# Patient Record
Sex: Male | Born: 1960
Health system: Southern US, Community
[De-identification: ages and names within clinical notes are randomized; demographics above are authoritative.]

## PROBLEM LIST (undated history)

## (undated) DIAGNOSIS — M545 Low back pain, unspecified: Secondary | ICD-10-CM

## (undated) DIAGNOSIS — I10 Essential (primary) hypertension: Secondary | ICD-10-CM

## (undated) DIAGNOSIS — E785 Hyperlipidemia, unspecified: Secondary | ICD-10-CM

## (undated) DIAGNOSIS — M1990 Unspecified osteoarthritis, unspecified site: Secondary | ICD-10-CM

## (undated) HISTORY — PX: OTHER SURGICAL HISTORY: SHX169

## (undated) HISTORY — PX: TONSILLECTOMY: SHX5217

## (undated) HISTORY — DX: Low back pain: M54.5

## (undated) HISTORY — DX: Unspecified osteoarthritis, unspecified site: M19.90

## (undated) HISTORY — DX: Hyperlipidemia, unspecified: E78.5

## (undated) HISTORY — PX: WISDOM TOOTH EXTRACTION: SHX21

## (undated) HISTORY — DX: Essential (primary) hypertension: I10

## (undated) HISTORY — DX: Low back pain, unspecified: M54.50

## (undated) HISTORY — PX: COLONOSCOPY: SHX174

## (undated) HISTORY — PX: CIRCUMCISION: SHX1350

---

## 1986-08-11 DIAGNOSIS — F1910 Other psychoactive substance abuse, uncomplicated: Secondary | ICD-10-CM

## 1986-08-11 HISTORY — DX: Other psychoactive substance abuse, uncomplicated: F19.10

## 1998-08-22 ENCOUNTER — Ambulatory Visit (HOSPITAL_BASED_OUTPATIENT_CLINIC_OR_DEPARTMENT_OTHER): Admission: RE | Admit: 1998-08-22 | Discharge: 1998-08-22 | Payer: Self-pay | Admitting: Orthopedic Surgery

## 2000-04-23 ENCOUNTER — Ambulatory Visit (HOSPITAL_COMMUNITY): Admission: RE | Admit: 2000-04-23 | Discharge: 2000-04-23 | Payer: Self-pay | Admitting: Urology

## 2001-08-24 ENCOUNTER — Encounter: Payer: Self-pay | Admitting: Family Medicine

## 2001-08-24 ENCOUNTER — Encounter: Admission: RE | Admit: 2001-08-24 | Discharge: 2001-08-24 | Payer: Self-pay | Admitting: Family Medicine

## 2001-08-28 ENCOUNTER — Encounter: Payer: Self-pay | Admitting: Family Medicine

## 2001-08-28 ENCOUNTER — Encounter: Admission: RE | Admit: 2001-08-28 | Discharge: 2001-08-28 | Payer: Self-pay | Admitting: Family Medicine

## 2001-08-31 ENCOUNTER — Encounter: Payer: Self-pay | Admitting: Family Medicine

## 2001-08-31 ENCOUNTER — Encounter: Admission: RE | Admit: 2001-08-31 | Discharge: 2001-08-31 | Payer: Self-pay | Admitting: Family Medicine

## 2003-06-07 ENCOUNTER — Encounter: Admission: RE | Admit: 2003-06-07 | Discharge: 2003-06-07 | Payer: Self-pay | Admitting: Family Medicine

## 2005-01-01 ENCOUNTER — Ambulatory Visit: Payer: Self-pay | Admitting: Family Medicine

## 2005-03-04 ENCOUNTER — Ambulatory Visit: Payer: Self-pay | Admitting: Family Medicine

## 2005-03-11 ENCOUNTER — Ambulatory Visit: Payer: Self-pay | Admitting: Family Medicine

## 2008-05-01 ENCOUNTER — Emergency Department (HOSPITAL_COMMUNITY): Admission: EM | Admit: 2008-05-01 | Discharge: 2008-05-01 | Payer: Self-pay | Admitting: Family Medicine

## 2009-08-16 ENCOUNTER — Ambulatory Visit: Payer: Self-pay | Admitting: Family Medicine

## 2009-08-16 DIAGNOSIS — F17200 Nicotine dependence, unspecified, uncomplicated: Secondary | ICD-10-CM | POA: Insufficient documentation

## 2009-08-16 DIAGNOSIS — R030 Elevated blood-pressure reading, without diagnosis of hypertension: Secondary | ICD-10-CM | POA: Insufficient documentation

## 2009-08-22 ENCOUNTER — Ambulatory Visit: Payer: Self-pay | Admitting: Family Medicine

## 2009-10-03 ENCOUNTER — Ambulatory Visit: Payer: Self-pay | Admitting: Family Medicine

## 2009-10-03 DIAGNOSIS — I10 Essential (primary) hypertension: Secondary | ICD-10-CM | POA: Insufficient documentation

## 2009-10-04 ENCOUNTER — Telehealth: Payer: Self-pay | Admitting: Family Medicine

## 2009-11-06 ENCOUNTER — Ambulatory Visit: Payer: Self-pay | Admitting: Family Medicine

## 2009-11-06 LAB — CONVERTED CEMR LAB
ALT: 13 units/L (ref 0–53)
AST: 16 units/L (ref 0–37)
Albumin: 4.1 g/dL (ref 3.5–5.2)
Alkaline Phosphatase: 57 units/L (ref 39–117)
BUN: 14 mg/dL (ref 6–23)
Basophils Absolute: 0 10*3/uL (ref 0.0–0.1)
Basophils Relative: 0.1 % (ref 0.0–3.0)
Bilirubin, Direct: 0 mg/dL (ref 0.0–0.3)
CO2: 31 meq/L (ref 19–32)
Calcium: 9.3 mg/dL (ref 8.4–10.5)
Chloride: 105 meq/L (ref 96–112)
Cholesterol: 223 mg/dL — ABNORMAL HIGH (ref 0–200)
Creatinine, Ser: 1.2 mg/dL (ref 0.4–1.5)
Direct LDL: 161.2 mg/dL
Eosinophils Absolute: 0.1 10*3/uL (ref 0.0–0.7)
Eosinophils Relative: 2.2 % (ref 0.0–5.0)
GFR calc non Af Amer: 82.65 mL/min (ref >60)
Glucose, Bld: 95 mg/dL (ref 70–99)
HCT: 43.4 % (ref 39.0–52.0)
HDL: 56.1 mg/dL (ref >39.00)
Hemoglobin: 14.2 g/dL (ref 13.0–17.0)
Lymphocytes Relative: 42.4 % (ref 12.0–46.0)
Lymphs Abs: 1.9 10*3/uL (ref 0.7–4.0)
MCHC: 32.7 g/dL (ref 30.0–36.0)
MCV: 91.5 fL (ref 78.0–100.0)
Monocytes Absolute: 0.5 10*3/uL (ref 0.1–1.0)
Monocytes Relative: 11.1 % (ref 3.0–12.0)
Neutro Abs: 1.9 10*3/uL (ref 1.4–7.7)
Neutrophils Relative %: 44.2 % (ref 43.0–77.0)
PSA: 1.08 ng/mL (ref 0.10–4.00)
Platelets: 158 10*3/uL (ref 150.0–400.0)
Potassium: 4.7 meq/L (ref 3.5–5.1)
RBC: 4.74 M/uL (ref 4.22–5.81)
RDW: 13.5 % (ref 11.5–14.6)
Sodium: 145 meq/L (ref 135–145)
TSH: 1.75 microintl units/mL (ref 0.35–5.50)
Total Bilirubin: 0.7 mg/dL (ref 0.3–1.2)
Total CHOL/HDL Ratio: 4
Total Protein: 6.8 g/dL (ref 6.0–8.3)
Triglycerides: 57 mg/dL (ref 0.0–149.0)
VLDL: 11.4 mg/dL (ref 0.0–40.0)
WBC: 4.4 10*3/uL — ABNORMAL LOW (ref 4.5–10.5)

## 2009-12-04 ENCOUNTER — Ambulatory Visit: Payer: Self-pay | Admitting: Family Medicine

## 2010-09-10 NOTE — Assessment & Plan Note (Signed)
Summary: pt not feeling better/njr   History of Present Illness: Carlos Walker is a 50 year old male, who was seen on January the sixth for viral infection.  He was given a note to to cover him from January the fourth when he first got ill through January the 11th.  He states he wants to file FMLA .  Explained FMLa is not for a viral infection.  He states he has to go to an urgent care on 68 to be certified to go back to work.  Okay to go back to work today as previously noted  Allergies: No Known Drug Allergies   Complete Medication List: 1)  Chantix Starting Month Pak 0.5 Mg X 11 & 1 Mg X 42 Tabs (Varenicline tartrate) .... Uad 2)  Hydromet 5-1.5 Mg/75ml Syrp (Hydrocodone-homatropine) .Marland Kitchen.. 1 or 2 tsps at bedtime as needed  Other Orders: No Charge Patient Arrived (NCPA0) (NCPA0)

## 2010-09-10 NOTE — Letter (Signed)
Summary: Out of Work  Adult nurse at [employer]  [address]   [place], [place] [address]   Phone: [phone]  Fax: [phone]    August 16, 2009   Employee:  SEAN MALINOWSKI Nez    To Whom It May Concern:   For Medical reasons, please excuse the above named employee from work for the following dates:  Start:   August 14, 2009  End:    If you need additional information, please feel free to contact our office.         Sincerely,    Kelle Darting, MD

## 2010-09-10 NOTE — Assessment & Plan Note (Signed)
Summary: 1 month rov/njr   Vital Signs:  Patient profile:   50 year old male Weight:      199 pounds Temp:     98.7 degrees F oral BP sitting:   150 / 102  (left arm) Cuff size:   regular  Vitals Entered By: Kern Reap CMA Duncan Dull) (October 03, 2009 11:27 AM)  Reason for Visit follow up blood pressure  History of Present Illness: Carlos Walker is a 50 year old male, smoker, but down to 3 cigarettes daily who comes back today for evaluation of tobacco abuse and hypertension.  He's cut down to 3 cigarettes a day.  Has not purchased the chantix.  We discussed her his options.  BP at home, still elevated, 130 to 140 systolic 90, diastolic.  BP here today 150/102.  Mother, father both deceased, had underlying hypertension.  One sister with high blood pressure  Allergies: No Known Drug Allergies PMH-FH-SH reviewed for relevance  Review of Systems      See HPI  Physical Exam  General:  Well-developed,well-nourished,in no acute distress; alert,appropriate and cooperative throughout examination Heart:  150/102   Impression & Recommendations:  Problem # 1:  HYPERTENSION NEC (ICD-997.91) Assessment New  Problem # 2:  TOBACCO USE (ICD-305.1) Assessment: Improved  His updated medication list for this problem includes:    Chantix Starting Month Pak 0.5 Mg X 11 & 1 Mg X 42 Tabs (Varenicline tartrate) ..... Uad  Orders: Tobacco use cessation intermediate 3-10 minutes ([address]406)  Complete Medication List: 1)  Chantix Starting Month Pak 0.5 Mg X 11 & 1 Mg X 42 Tabs (Varenicline tartrate) .... Uad 2)  Lisinopril 10 Mg Tabs (Lisinopril) .... Take 1 tablet by mouth every morning  Patient Instructions: 1)  tried the chantix  as was outlined. 2)  Begin lisinopril 10 mg q.a.m. daily check a morning blood pressure daily.  Return in 4 weeks for follow Prescriptions: LISINOPRIL 10 MG TABS (LISINOPRIL) Take 1 tablet by mouth every morning  #100 x 3   Entered and Authorized by:   Roderick Pee MD   Signed by:   Roderick Pee MD on 10/03/2009   Method used:   Electronically to        [facility] [address] [id number][facility] (retail)       [address]       [place], [place]  [address]       Ph: [phone] or [phone]       Fax: [phone]   RxID:   [id number]

## 2010-09-10 NOTE — Progress Notes (Signed)
Summary: samples  Phone Note Call from Patient   Caller: Patient Call For: Roderick Pee MD Summary of Call: wants to know if you have any samples of Levitra? Initial call taken by: Raechel Ache, RN,  October 04, 2009 12:19 PM  Follow-up for Phone Call        no.........call-in Levitra 20 mg, dispense 6 refills x 11 Follow-up by: Roderick Pee MD,  October 08, 2009 8:22 AM  Additional Follow-up for Phone Call Additional follow up Details #1::        left message on machine for patient to call back if he would like a rx called in Additional Follow-up by: Kern Reap CMA Duncan Dull),  October 08, 2009 10:54 AM    Additional Follow-up for Phone Call Additional follow up Details #2::    patient is going to do some research and then call back for a new rx. Follow-up by: Kern Reap CMA Duncan Dull),  October 10, 2009 1:07 PM

## 2010-09-10 NOTE — Assessment & Plan Note (Signed)
Summary: PT RE-EST // RS   Vital Signs:  Patient profile:   50 year old male Height:      73 inches Weight:      199 pounds BMI:     26.35 Temp:     97.9 degrees F oral BP sitting:   130 / 96  (left arm) Cuff size:   regular  Vitals Entered By: Kern Reap CMA Duncan Dull) (August 16, 2009 12:00 PM)  Reason for Visit body aches, chills, abd cramps, sinus pressure, cough  History of Present Illness: Carlos Walker is a 50 y/o male married, smoker who comes in today as a new patient for evaluation of multiple issues.  The past 5, days.  He's had fever, chills, body aches nonproductive cough and head congestion.  He did not get the flu shot this fall.  His daughter, who is 23 years old and his wife at home or well.  He went to an urgent care because of redness in his left eye.  They gave him drops but it didn't help.  He saw an ophthalmologist to change his medications and now the redness in his left eye is clearing up.  No visual difficulty.  He also smokes a third of a pack a cigarettes a day, and would like to quit.  He states that his wife tried the chantix program, but had side effects, and had to stop.  He also has a positive family history of hypertension.  BP today 130/96 however, he is taking over-the-counter cough and cold medications.  He says on occasion.  His blood pressure we elevated however, other times it will be normal.  he also has seen Dr. Ethelene Hal  at [facility] orthopedics for evaluation of degenerative disk disease in his lumbar spine.  He's had one epidural steroid injection  Preventive Screening-Counseling & Management      Drug Use:  no.    Allergies (verified): No Known Drug Allergies  Past History:  Past medical, surgical, family and social histories (including risk factors) reviewed, and no changes noted (except as noted below).  Past Medical History: Reviewed history from 05/05/2007 and no changes required. Arthritis  Past Surgical History: Reviewed history  from 05/05/2007 and no changes required. Tonsillectomy  Family History: Reviewed history from 05/05/2007 and no changes required. Family History Diabetes 1st degree relative Family History Hypertension Family History of Stroke F 1st degree relative <60 Family History of Cardiovascular disorder Family History of Arthritis Family History Kidney disease  Social History: Reviewed history from 05/05/2007 and no changes required. Occupation: Current Smoker Married Alcohol use-no Drug use-no Drug Use:  no  Review of Systems      See HPI  Physical Exam  General:  Well-developed,well-nourished,in no acute distress; alert,appropriate and cooperative throughout examination Head:  Normocephalic and atraumatic without obvious abnormalities. No apparent alopecia or balding. Eyes:  No corneal or conjunctival inflammation noted. EOMI. Perrla. Funduscopic exam benign, without hemorrhages, exudates or papilledema. Vision grossly normal. Ears:  External ear exam shows no significant lesions or deformities.  Otoscopic examination reveals clear canals, tympanic membranes are intact bilaterally without bulging, retraction, inflammation or discharge. Hearing is grossly normal bilaterally. Nose:  External nasal examination shows no deformity or inflammation. Nasal mucosa are pink and moist without lesions or exudates. Mouth:  Oral mucosa and oropharynx without lesions or exudates.  Teeth in good repair. Neck:  No deformities, masses, or tenderness noted. Chest Wall:  No deformities, masses, tenderness or gynecomastia noted. Lungs:  Normal respiratory effort, chest  expands symmetrically. Lungs are clear to auscultation, no crackles or wheezes.   Problems:  Medical Problems Added: 1)  Dx of Elevated Blood Pressure Without Diagnosis of Hypertension  (ICD-796.2) 2)  Dx of Tobacco Use  (ICD-305.1) 3)  Dx of Viral Infection-unspec  (ICD-079.99)  Impression & Recommendations:  Problem # 1:  TOBACCO  USE (ICD-305.1) Assessment New  His updated medication list for this problem includes:    Chantix Starting Month Pak 0.5 Mg X 11 & 1 Mg X 42 Tabs (Varenicline tartrate) ..... Uad  Orders: Prescription Created Electronically [phone]) Tobacco use cessation intermediate 3-10 minutes (60454)  Problem # 2:  VIRAL INFECTION-UNSPEC (ICD-079.99) Assessment: New  His updated medication list for this problem includes:    Hydromet 5-1.5 Mg/59ml Syrp (Hydrocodone-homatropine) .Marland Kitchen... 1 or 2 tsps at bedtime as needed  Orders: Prescription Created Electronically [phone]) Tobacco use cessation intermediate 3-10 minutes (91478)  Problem # 3:  ELEVATED BLOOD PRESSURE WITHOUT DIAGNOSIS OF HYPERTENSION (ICD-796.2) Assessment: New  Orders: Prescription Created Electronically [phone]) Tobacco use cessation intermediate 3-10 minutes (13086)  Complete Medication List: 1)  Chantix Starting Month Pak 0.5 Mg X 11 & 1 Mg X 42 Tabs (Varenicline tartrate) .... Uad 2)  Hydromet 5-1.5 Mg/66ml Syrp (Hydrocodone-homatropine) .Marland Kitchen.. 1 or 2 tsps at bedtime as needed   Patient Instructions: 1)  Get plenty of rest, drink lots of clear liquids, and use Tylenol or Ibuprofen for fever and comfort. Return in 7-10 days if you're not better:sooner if you're feeling worse. 2)  Take 650-1000mg  of Tylenol every 4-6 hours as needed for relief of pain or comfort of fever AVOID taking more than 4000mg   in a 24 hour period (can cause liver damage in higher doses). 3)  he may take one or 2 teaspoons of Hydromet at bedtime as needed for cough. 4)  To not take any over-the-counter medication. 5)  Purchase.  A digital blood pressure cuff and check your blood pressure daily in the morning.  Return in 4 weeks for follow-up.  When you return bring a record of all your blood pressure readings and the device. 6)  Begin the chantix as outlined Prescriptions: HYDROMET 5-1.5 MG/5ML SYRP (HYDROCODONE-HOMATROPINE) 1 or 2 tsps at bedtime as needed   #8oz x 1   Entered and Authorized by:   Roderick Pee MD   Signed by:   Roderick Pee MD on 08/16/2009   Method used:   Print then Give to Patient   RxID:   [id number] CHANTIX STARTING MONTH PAK 0.5 MG X 11 & 1 MG X 42 TABS (VARENICLINE TARTRATE) UAD  #1 x 0   Entered and Authorized by:   Roderick Pee MD   Signed by:   Roderick Pee MD on 08/16/2009   Method used:   Print then Give to Patient   RxID:   [id number]

## 2010-09-10 NOTE — Assessment & Plan Note (Signed)
Summary: CPX/NJR   Vital Signs:  Patient profile:   50 year old male Height:      72.5 inches Weight:      191 pounds Temp:     98.1 degrees F oral BP sitting:   112 / 88  (left arm) Cuff size:   regular  Vitals Entered By: Kern Reap CMA Duncan Dull) (December 04, 2009 10:33 AM) CC: cpx Is Patient Diabetic? No Pain Assessment Patient in pain? no        CC:  cpx.  History of Present Illness:  [name] is a 50 year old, married male, a smoker, who comes in today for physical evaluation because of underlying hypertension.  He takes lisinopril 10 mg daily BP today 112/88.  No side effects from medication.  We put them on a smoking cessation program was chantix however, he states he couldn't tolerate it.  He had side effects had to stop it.  He continues to smoke two to 3 cigarettes per day.  He had a recent eye exam which was normal.  He thinks he had a tetanus booster.  Six years ago to check his records  Allergies: No Known Drug Allergies  Past History:  Past medical, surgical, family and social histories (including risk factors) reviewed, and no changes noted (except as noted below).  Past Medical History: Reviewed history from 05/05/2007 and no changes required. Arthritis  Past Surgical History: Reviewed history from 05/05/2007 and no changes required. Tonsillectomy  Family History: Reviewed history from 05/05/2007 and no changes required. Family History Diabetes 1st degree relative Family History Hypertension Family History of Stroke F 1st degree relative <60 Family History of Cardiovascular disorder Family History of Arthritis Family History Kidney disease  Social History: Reviewed history from 08/16/2009 and no changes required. Occupation: Current Smoker Married Alcohol use-no Drug use-no  Review of Systems      See HPI  Physical Exam  General:  Well-developed,well-nourished,in no acute distress; alert,appropriate and cooperative throughout  examination Head:  Normocephalic and atraumatic without obvious abnormalities. No apparent alopecia or balding. Eyes:  No corneal or conjunctival inflammation noted. EOMI. Perrla. Funduscopic exam benign, without hemorrhages, exudates or papilledema. Vision grossly normal. Ears:  External ear exam shows no significant lesions or deformities.  Otoscopic examination reveals clear canals, tympanic membranes are intact bilaterally without bulging, retraction, inflammation or discharge. Hearing is grossly normal bilaterally. Nose:  External nasal examination shows no deformity or inflammation. Nasal mucosa are pink and moist without lesions or exudates. Mouth:  Oral mucosa and oropharynx without lesions or exudates.  Teeth in good repair. Neck:  No deformities, masses, or tenderness noted. Chest Wall:  No deformities, masses, tenderness or gynecomastia noted. Breasts:  No masses or gynecomastia noted Lungs:  Normal respiratory effort, chest expands symmetrically. Lungs are clear to auscultation, no crackles or wheezes. Heart:  Normal rate and regular rhythm. S1 and S2 normal without gallop, murmur, click, rub or other extra sounds. Abdomen:  Bowel sounds positive,abdomen soft and non-tender without masses, organomegaly or hernias noted. Rectal:  No external abnormalities noted. Normal sphincter tone. No rectal masses or tenderness. Genitalia:  Testes bilaterally descended without nodularity, tenderness or masses. No scrotal masses or lesions. No penis lesions or urethral discharge. Prostate:  Prostate gland firm and smooth, no enlargement, nodularity, tenderness, mass, asymmetry or induration. Msk:  No deformity or scoliosis noted of thoracic or lumbar spine.   Pulses:  R and L carotid,radial,femoral,dorsalis pedis and posterior tibial pulses are full and equal bilaterally Extremities:  No clubbing,  cyanosis, edema, or deformity noted with normal full range of motion of all joints.   Neurologic:  No  cranial nerve deficits noted. Station and gait are normal. Plantar reflexes are down-going bilaterally. DTRs are symmetrical throughout. Sensory, motor and coordinative functions appear intact. Skin:  Intact without suspicious lesions or rashes Cervical Nodes:  No lymphadenopathy noted Axillary Nodes:  No palpable lymphadenopathy Inguinal Nodes:  No significant adenopathy Psych:  Cognition and judgment appear intact. Alert and cooperative with normal attention span and concentration. No apparent delusions, illusions, hallucinations   Impression & Recommendations:  Problem # 1:  HYPERTENSION NEC (ICD-997.91) Assessment Improved  Orders: EKG w/ Interpretation (93000)  Problem # 2:  TOBACCO USE (ICD-305.1) Assessment: Unchanged  Problem # 3:  Preventive Health Care (ICD-V70.0) Assessment: Unchanged  Orders: EKG w/ Interpretation (93000)  Complete Medication List: 1)  Lisinopril 10 Mg Tabs (Lisinopril) .... Take 1 tablet by mouth every morning  Patient Instructions: 1)  try the nicotine patches, obviously it's important that you stop smoking completely. 2)  Continue to take your medication daily.  Check your blood pressure weekly.  If he get an elevated reading and check it daily for two weeks.  If after two weeks y  blood pressure is consistently elevated.  Return to see [place] for follow-up.  If however, y  blood pressure dropped back to normal then, just go back and check it weekly. 3)  Please schedule a follow-up appointment in 1 year. 4)  Take an Aspirin every day.

## 2010-09-10 NOTE — Assessment & Plan Note (Signed)
Summary: 1 MTH ROV // RS   Vital Signs:  Patient profile:   50 year old male Weight:      195 pounds Temp:     98.2 degrees F oral BP sitting:   120 / 86  (left arm) Cuff size:   regular  Vitals Entered By: Kern Reap CMA Duncan Dull) (November 06, 2009 10:34 AM) CC: follow-up visit   CC:  follow-up visit.  History of Present Illness: Carlos Walker is a 50 year old single male, smoker, who comes in today for follow-up of hypertension, and tobacco abuse.  We start him on lisinopril 10 mg daily one month ago.  BP is now normal 120/86.  No side effects from medication.  He started the smoking cessation program with the chantix however, he only took it for a couple days because it said it caused him GI side effects.  We discussed various strategies.  He will try again  Allergies: No Known Drug Allergies  Past History:  Past medical, surgical, family and social histories (including risk factors) reviewed for relevance to current acute and chronic problems.  Past Medical History: Reviewed history from 05/05/2007 and no changes required. Arthritis  Past Surgical History: Reviewed history from 05/05/2007 and no changes required. Tonsillectomy  Family History: Reviewed history from 05/05/2007 and no changes required. Family History Diabetes 1st degree relative Family History Hypertension Family History of Stroke F 1st degree relative <60 Family History of Cardiovascular disorder Family History of Arthritis Family History Kidney disease  Social History: Reviewed history from 08/16/2009 and no changes required. Occupation: Current Smoker Married Alcohol use-no Drug use-no  Review of Systems      See HPI  Physical Exam  General:  Well-developed,well-nourished,in no acute distress; alert,appropriate and cooperative throughout examination Heart:  120/86   Impression & Recommendations:  Problem # 1:  HYPERTENSION NEC (ICD-997.91) Assessment Improved  Orders: Venipuncture  ([address]) TLB-Lipid Panel (80061-LIPID) TLB-BMP (Basic Metabolic Panel-BMET) (80048-METABOL) TLB-CBC Platelet - w/Differential (85025-CBCD) TLB-Hepatic/Liver Function Pnl (80076-HEPATIC) TLB-TSH (Thyroid Stimulating Hormone) (84443-TSH) TLB-PSA (Prostate Specific Antigen) (84153-PSA) Tobacco use cessation intermediate 3-10 minutes (99406) UA Dipstick w/o Micro (automated)  (81003)  Problem # 2:  TOBACCO USE (ICD-305.1) Assessment: Unchanged  The following medications were removed from the medication list:    Chantix Starting Month Pak 0.5 Mg X 11 & 1 Mg X 42 Tabs (Varenicline tartrate) ..... Uad  Orders: Venipuncture ([address]454) TLB-Lipid Panel (80061-LIPID) TLB-BMP (Basic Metabolic Panel-BMET) (80048-METABOL) TLB-CBC Platelet - w/Differential (85025-CBCD) TLB-Hepatic/Liver Function Pnl (80076-HEPATIC) TLB-TSH (Thyroid Stimulating Hormone) (84443-TSH) TLB-PSA (Prostate Specific Antigen) (84153-PSA) Tobacco use cessation intermediate 3-10 minutes (09811)  Complete Medication List: 1)  Lisinopril 10 Mg Tabs (Lisinopril) .... Take 1 tablet by mouth every morning  Patient Instructions: 1)  continue the lisinopril, 10 mg daily.  Since her blood pressure has normalized to check your blood pressure weekly. 2)  Also restart the chantix by taking one half of the white tablet Monday, Wednesday, Friday, and tilt are gone, then take half of a blue tablet once daily.  Two weeks after u  restarted the chantix stop smoking completely

## 2010-10-29 ENCOUNTER — Encounter (INDEPENDENT_AMBULATORY_CARE_PROVIDER_SITE_OTHER): Payer: Self-pay | Admitting: *Deleted

## 2010-11-07 NOTE — Letter (Signed)
Summary: Pre Visit Letter Revised  [facility]  [address] [place], [place] [address]   Phone: [phone]  Fax: [phone]        10/29/2010 MRN: [id number] [facility] [address] [place], [place]  [address]             Procedure Date: 12-13-10           Direct Colon---Dr. Leone Payor   Welcome to the Gastroenterology Division at [facility].    You are scheduled to see a nurse for your pre-procedure visit on 11-29-10 at 10:00a.m. on the 3rd floor at [facility], [address]  We ask that you try to arrive at our office 15 minutes prior to your appointment time to allow for check-in.  Please take a minute to review the attached form.  If you answer "Yes" to one or more of the questions on the first page, we ask that you call the person listed at your earliest opportunity.  If you answer "No" to all of the questions, please complete the rest of the form and bring it to your appointment.    Your nurse visit will consist of discussing your medical and surgical history, your immediate family medical history, and your medications.   If you are unable to list all of your medications on the form, please bring the medication bottles to your appointment and we will list them.  We will need to be aware of both prescribed and over the counter drugs.  We will need to know exact dosage information as well.    Please be prepared to read and sign documents such as consent forms, a financial agreement, and acknowledgement forms.  If necessary, and with your consent, a friend or relative is welcome to sit-in on the nurse visit with you.  Please bring your insurance card so that we may make a copy of it.  If your insurance requires a referral to see a specialist, please bring your referral form from your primary care physician.  No co-pay is required for this nurse visit.     If you cannot keep your appointment, please call [phone] to cancel or reschedule  prior to your appointment date.  This allows [place] the opportunity to schedule an appointment for another patient in need of care.    Thank you for choosing [facility] for your medical needs.  We appreciate the opportunity to care for you.  Please visit [place] at our website  to learn more about our practice.  Sincerely, The Gastroenterology Division

## 2010-11-18 ENCOUNTER — Other Ambulatory Visit: Payer: Self-pay | Admitting: Family Medicine

## 2010-11-29 ENCOUNTER — Ambulatory Visit (AMBULATORY_SURGERY_CENTER): Payer: Managed Care, Other (non HMO) | Admitting: *Deleted

## 2010-11-29 ENCOUNTER — Encounter: Payer: Self-pay | Admitting: Internal Medicine

## 2010-11-29 VITALS — Ht 74.0 in | Wt 194.0 lb

## 2010-11-29 DIAGNOSIS — Z1211 Encounter for screening for malignant neoplasm of colon: Secondary | ICD-10-CM

## 2010-11-29 MED ORDER — PEG-KCL-NACL-NASULF-NA ASC-C 100 G PO SOLR: ORAL | Status: DC

## 2010-12-12 ENCOUNTER — Encounter: Payer: Self-pay | Admitting: Internal Medicine

## 2010-12-13 ENCOUNTER — Ambulatory Visit (AMBULATORY_SURGERY_CENTER): Payer: Managed Care, Other (non HMO) | Admitting: Internal Medicine

## 2010-12-13 ENCOUNTER — Encounter: Payer: Self-pay | Admitting: Internal Medicine

## 2010-12-13 VITALS — BP 139/78 | HR 54 | Temp 96.8°F | Resp 20 | Ht 74.0 in | Wt 193.0 lb

## 2010-12-13 DIAGNOSIS — Z1211 Encounter for screening for malignant neoplasm of colon: Secondary | ICD-10-CM

## 2010-12-13 MED ORDER — SODIUM CHLORIDE 0.9 % IV SOLN: 500.0000 mL | INTRAVENOUS | Status: DC

## 2010-12-13 NOTE — Patient Instructions (Addendum)
Your screening colonoscopy was normal. Your next routine screening colonoscopy should be in about 10 years. You do not need other colon screening tests like stool cards looking for blood, either. If you have bleeding problems or other bowel disturbances you may need another colonoscpy sooner.  Iva Boop, MD, FACG DISCHARGED INSTRUCTIONS GIVEN WITH VERBAL UNDERSTANDING.

## 2010-12-16 ENCOUNTER — Telehealth: Payer: Self-pay

## 2010-12-16 NOTE — Telephone Encounter (Signed)
No caller ID on answering machine. 

## 2010-12-26 ENCOUNTER — Other Ambulatory Visit (INDEPENDENT_AMBULATORY_CARE_PROVIDER_SITE_OTHER): Payer: Managed Care, Other (non HMO)

## 2010-12-26 DIAGNOSIS — Z0000 Encounter for general adult medical examination without abnormal findings: Secondary | ICD-10-CM

## 2010-12-26 LAB — POCT URINALYSIS DIPSTICK
Bilirubin, UA: NEGATIVE
Glucose, UA: NEGATIVE
Nitrite, UA: NEGATIVE
Urobilinogen, UA: 0.2

## 2010-12-26 LAB — HEPATIC FUNCTION PANEL
ALT: 20 U/L (ref 0–53)
AST: 17 U/L (ref 0–37)
Bilirubin, Direct: 0.1 mg/dL (ref 0.0–0.3)
Total Bilirubin: 0.3 mg/dL (ref 0.3–1.2)
Total Protein: 6.5 g/dL (ref 6.0–8.3)

## 2010-12-26 LAB — BASIC METABOLIC PANEL
BUN: 15 mg/dL (ref 6–23)
Chloride: 107 mEq/L (ref 96–112)
GFR: 86.41 mL/min (ref >60.00)
Potassium: 4.8 mEq/L (ref 3.5–5.1)
Sodium: 142 mEq/L (ref 135–145)

## 2010-12-26 LAB — CBC WITH DIFFERENTIAL/PLATELET
Basophils Relative: 0.4 % (ref 0.0–3.0)
Eosinophils Relative: 1.6 % (ref 0.0–5.0)
HCT: 42.4 % (ref 39.0–52.0)
Lymphs Abs: 2 10*3/uL (ref 0.7–4.0)
MCV: 90.3 fl (ref 78.0–100.0)
Monocytes Absolute: 0.6 10*3/uL (ref 0.1–1.0)
Monocytes Relative: 11.7 % (ref 3.0–12.0)
Platelets: 152 10*3/uL (ref 150.0–400.0)
RBC: 4.7 Mil/uL (ref 4.22–5.81)
WBC: 5.2 10*3/uL (ref 4.5–10.5)

## 2010-12-26 LAB — PSA: PSA: 1.04 ng/mL (ref 0.10–4.00)

## 2010-12-26 LAB — LIPID PANEL
Cholesterol: 169 mg/dL (ref 0–200)
LDL Cholesterol: 112 mg/dL — ABNORMAL HIGH (ref 0–99)
Total CHOL/HDL Ratio: 3

## 2010-12-26 LAB — TSH: TSH: 1.62 u[IU]/mL (ref 0.35–5.50)

## 2010-12-27 NOTE — Op Note (Signed)
[facility]  Patient:    Carlos Walker, Carlos Walker                      MRN: [id number] Proc. Date: 04/23/00 Adm. Date:  [id number] Attending:  Laqueta Jean                           Operative Report  PREOPERATIVE DIAGNOSES:  Phimosis.  POSTOPERATIVE DIAGNOSES:  Phimosis.  OPERATION PERFORMED:  Circumcision.  SURGEON:  Dr. Patsi Sears.  ANESTHESIA:  General.  PREPARATION:  After appropriate preanesthesia, the patient was brought to the operating room and placed on the operating table in dorsal supine position where general anesthesia was introduced. He remained in this position where the pubis was prepped with Betadine solution and draped in the usual fashion.  DESCRIPTION OF PROCEDURE:  Using a marking pen, the tissue around the ______ glands were outlined. The ______ attachment was incised, and sutured with 4-0 Vicryl suture. The incisions were then made and no bleeding was noted. ______ was dissected. A 4-0 Vicryl suture was then used to create 4 separate quadrants, and each quadrant was closed with interrupted 4-0 Vicryl suture. A sterile dressing was applied and Marcaine 0.5 plain was injected in the base of the penis. The patient was then awakened and taken to the recovery room in good condition. DD:  04/23/00 TD:  04/24/00 Job: [id number]

## 2011-01-02 ENCOUNTER — Ambulatory Visit (INDEPENDENT_AMBULATORY_CARE_PROVIDER_SITE_OTHER): Payer: Managed Care, Other (non HMO) | Admitting: Family Medicine

## 2011-01-02 ENCOUNTER — Encounter: Payer: Self-pay | Admitting: Family Medicine

## 2011-01-02 VITALS — BP 130/90 | Temp 98.3°F | Ht 74.0 in | Wt 196.0 lb

## 2011-01-02 DIAGNOSIS — I10 Essential (primary) hypertension: Secondary | ICD-10-CM

## 2011-01-02 DIAGNOSIS — IMO0002 Reserved for concepts with insufficient information to code with codable children: Secondary | ICD-10-CM

## 2011-01-02 MED ORDER — LISINOPRIL 10 MG PO TABS: 10.0000 mg | ORAL_TABLET | Freq: Every day | ORAL | Status: DC

## 2011-01-02 NOTE — Patient Instructions (Signed)
Continue the daily blood pressure medication, 10 mg, lisinopril.  Also add an 81 mg, baby aspirin daily.  Stop smoking completely,,,,,,,,,,, I would recommend he try this and takes one half tablet daily.  Return in one year, sooner if any problems.  Remember to check your blood pressure weekly

## 2011-01-02 NOTE — Progress Notes (Signed)
  Subjective:    Patient ID: Carlos Walker, male    DOB: Nov 17, 1960, 50 y.o.   MRN: [id number]  HPI  Carlos Walker is a delightful 50 year old, married male, smoker,,,,,,,, however he's down to 3 cigarettes a day,,,,,,,, his wife, quit smoking with the chantix program,,,,,,,,,,, who comes in today for general physical examination because of a history of underlying hypertension.  He takes lisinopril 10 mg daily.  BP normal.  Tetanus 2005.  Colonoscopy when he turned 14 normal.  Routine eye exam by ophthalmologist to screen for glaucoma.    Review of Systems  Constitutional: Negative.   HENT: Negative.   Eyes: Negative.   Respiratory: Negative.   Cardiovascular: Negative.   Gastrointestinal: Negative.   Genitourinary: Negative.   Musculoskeletal: Negative.   Skin: Negative.   Neurological: Negative.   Hematological: Negative.   Psychiatric/Behavioral: Negative.        Objective:   Physical Exam  Constitutional: He is oriented to person, place, and time. He appears well-developed and well-nourished.  HENT:  Head: Normocephalic and atraumatic.  Right Ear: External ear normal.  Left Ear: External ear normal.  Nose: Nose normal.  Mouth/Throat: Oropharynx is clear and moist.  Eyes: Conjunctivae and EOM are normal. Pupils are equal, round, and reactive to light.  Neck: Normal range of motion. Neck supple. No JVD present. No tracheal deviation present. No thyromegaly present.  Cardiovascular: Normal rate, regular rhythm, normal heart sounds and intact distal pulses.  Exam reveals no gallop and no friction rub.   No murmur heard. Pulmonary/Chest: Effort normal and breath sounds normal. No stridor. No respiratory distress. He has no wheezes. He has no rales. He exhibits no tenderness.  Abdominal: Soft. Bowel sounds are normal. He exhibits no distension and no mass. There is no tenderness. There is no rebound and no guarding.  Genitourinary: Rectum normal, prostate normal and penis normal.  Guaiac negative stool. No penile tenderness.  Musculoskeletal: Normal range of motion. He exhibits no edema and no tenderness.  Lymphadenopathy:    He has no cervical adenopathy.  Neurological: He is alert and oriented to person, place, and time. He has normal reflexes. No cranial nerve deficit. He exhibits normal muscle tone.  Skin: Skin is warm and dry. No rash noted. No erythema. No pallor.  Psychiatric: He has a normal mood and affect. His behavior is normal. Judgment and thought content normal.          Assessment & Plan:  Hypertension,,,,,,,, continue lisinopril 10 mg daily,,,,,, BP check weekly follow-up in one year or sooner if any problems.  Tobacco abuse encouraged to try the chantix program and stop smoking completely

## 2011-03-24 ENCOUNTER — Other Ambulatory Visit: Payer: Self-pay | Admitting: Family Medicine

## 2011-05-12 LAB — POCT URINALYSIS DIP (DEVICE)
Bilirubin Urine: NEGATIVE
Ketones, ur: NEGATIVE
Protein, ur: NEGATIVE
Specific Gravity, Urine: 1.02
pH: 6

## 2011-05-12 LAB — URINE CULTURE
Colony Count: NO GROWTH
Culture: NO GROWTH

## 2011-09-30 ENCOUNTER — Other Ambulatory Visit: Payer: Self-pay | Admitting: Family Medicine

## 2012-06-07 ENCOUNTER — Other Ambulatory Visit: Payer: Self-pay | Admitting: Family Medicine

## 2012-11-12 ENCOUNTER — Ambulatory Visit (INDEPENDENT_AMBULATORY_CARE_PROVIDER_SITE_OTHER): Payer: Managed Care, Other (non HMO) | Admitting: Family Medicine

## 2012-11-12 ENCOUNTER — Encounter: Payer: Self-pay | Admitting: Family Medicine

## 2012-11-12 VITALS — BP 130/88 | HR 78 | Temp 99.1°F | Wt 198.0 lb

## 2012-11-12 DIAGNOSIS — K6289 Other specified diseases of anus and rectum: Secondary | ICD-10-CM

## 2012-11-12 MED ORDER — HYDROCORTISONE ACE-PRAMOXINE 1-1 % RE CREA: TOPICAL_CREAM | Freq: Two times a day (BID) | RECTAL | Status: DC

## 2012-11-12 NOTE — Progress Notes (Signed)
  Subjective:    Patient ID: Carlos Walker, male    DOB: 1960-08-17, 52 y.o.   MRN: [id number]  HPI Here for 6 months of intermittent itching and burning around the anus. No blood seen. His BMs are normal and not painful. Preparation H helps a little.    Review of Systems  Constitutional: Negative.   Gastrointestinal: Negative.        Objective:   Physical Exam  Constitutional: He appears well-developed and well-nourished.  Genitourinary: Rectum normal.          Assessment & Plan:  Try Analpram HC prn

## 2013-01-10 ENCOUNTER — Other Ambulatory Visit (INDEPENDENT_AMBULATORY_CARE_PROVIDER_SITE_OTHER): Payer: Managed Care, Other (non HMO)

## 2013-01-10 DIAGNOSIS — Z0000 Encounter for general adult medical examination without abnormal findings: Secondary | ICD-10-CM

## 2013-01-10 LAB — HEPATIC FUNCTION PANEL
ALT: 15 U/L (ref 0–53)
AST: 15 U/L (ref 0–37)
Alkaline Phosphatase: 53 U/L (ref 39–117)
Bilirubin, Direct: 0.1 mg/dL (ref 0.0–0.3)
Total Protein: 6.7 g/dL (ref 6.0–8.3)

## 2013-01-10 LAB — CBC WITH DIFFERENTIAL/PLATELET
Basophils Absolute: 0 10*3/uL (ref 0.0–0.1)
Eosinophils Relative: 1.8 % (ref 0.0–5.0)
HCT: 43.8 % (ref 39.0–52.0)
Lymphs Abs: 1.8 10*3/uL (ref 0.7–4.0)
MCV: 88.4 fl (ref 78.0–100.0)
Monocytes Absolute: 0.5 10*3/uL (ref 0.1–1.0)
Neutrophils Relative %: 49.2 % (ref 43.0–77.0)
Platelets: 171 10*3/uL (ref 150.0–400.0)
RDW: 14.5 % (ref 11.5–14.6)
WBC: 4.7 10*3/uL (ref 4.5–10.5)

## 2013-01-10 LAB — BASIC METABOLIC PANEL
Calcium: 9.4 mg/dL (ref 8.4–10.5)
GFR: 81.61 mL/min (ref >60.00)
Potassium: 4.7 mEq/L (ref 3.5–5.1)
Sodium: 140 mEq/L (ref 135–145)

## 2013-01-10 LAB — PSA: PSA: 1.01 ng/mL (ref 0.10–4.00)

## 2013-01-10 LAB — POCT URINALYSIS DIPSTICK
Blood, UA: NEGATIVE
Ketones, UA: NEGATIVE
Protein, UA: NEGATIVE
Spec Grav, UA: 1.025
Urobilinogen, UA: 0.2
pH, UA: 6

## 2013-01-10 LAB — LIPID PANEL
Cholesterol: 214 mg/dL — ABNORMAL HIGH (ref 0–200)
HDL: 53.3 mg/dL (ref >39.00)
Triglycerides: 45 mg/dL (ref 0.0–149.0)
VLDL: 9 mg/dL (ref 0.0–40.0)

## 2013-01-14 ENCOUNTER — Encounter: Payer: Self-pay | Admitting: Family Medicine

## 2013-01-14 ENCOUNTER — Ambulatory Visit (INDEPENDENT_AMBULATORY_CARE_PROVIDER_SITE_OTHER): Payer: Managed Care, Other (non HMO) | Admitting: Family Medicine

## 2013-01-14 VITALS — BP 120/80 | Temp 98.2°F | Ht 73.5 in | Wt 200.0 lb

## 2013-01-14 DIAGNOSIS — I10 Essential (primary) hypertension: Secondary | ICD-10-CM

## 2013-01-14 DIAGNOSIS — Z0000 Encounter for general adult medical examination without abnormal findings: Secondary | ICD-10-CM

## 2013-01-14 DIAGNOSIS — F17200 Nicotine dependence, unspecified, uncomplicated: Secondary | ICD-10-CM

## 2013-01-14 DIAGNOSIS — IMO0002 Reserved for concepts with insufficient information to code with codable children: Secondary | ICD-10-CM

## 2013-01-14 MED ORDER — VARENICLINE TARTRATE 1 MG PO TABS: ORAL_TABLET | ORAL | Status: DC

## 2013-01-14 MED ORDER — LISINOPRIL 10 MG PO TABS: 10.0000 mg | ORAL_TABLET | Freq: Every day | ORAL | Status: DC

## 2013-01-14 NOTE — Patient Instructions (Addendum)
Continue the lisinopril one tablet daily for good blood pressure control  Chantix 1 mg............... One half tab every morning  Begin a tapering program.......Marland Kitchen Decreased by 1 per week  Followup in 4 weeks  Use over-the-counter cortisone cream nightly when necessary for rectal itching

## 2013-01-14 NOTE — Progress Notes (Signed)
  Subjective:    Patient ID: Carlos Walker, male    DOB: June 27, 1961, 52 y.o.   MRN: [id number]  HPIShelton is a 52 year old male married smoker,,,,,,, 7 cigarettes a day,,,,,, who comes in today for his annual physical because of an underlying history of hypertension  He takes lisinopril 10 mg daily BP 120/80  He gets routine eye care, dental care, colonoscopy normal in GI  He's had some issues with that rectal itching. He's also had occasional bright red rectal bleeding however he had a colonoscopy which was normal. He states he was given some medicated cream and that resolved the problem however it comes back every now and then  He smokes 7 cigarettes a day and is ready to quit    Review of Systems  Constitutional: Negative.   HENT: Negative.   Eyes: Negative.   Respiratory: Negative.   Cardiovascular: Negative.   Gastrointestinal: Negative.   Genitourinary: Negative.   Musculoskeletal: Negative.   Skin: Negative.   Neurological: Negative.   Psychiatric/Behavioral: Negative.        Objective:   Physical Exam  Constitutional: He is oriented to person, place, and time. He appears well-developed and well-nourished.  HENT:  Head: Normocephalic and atraumatic.  Right Ear: External ear normal.  Left Ear: External ear normal.  Nose: Nose normal.  Mouth/Throat: Oropharynx is clear and moist.  Eyes: Conjunctivae and EOM are normal. Pupils are equal, round, and reactive to light.  Neck: Normal range of motion. Neck supple. No JVD present. No tracheal deviation present. No thyromegaly present.  Cardiovascular: Normal rate, regular rhythm, normal heart sounds and intact distal pulses.  Exam reveals no gallop and no friction rub.   No murmur heard. Pulmonary/Chest: Effort normal and breath sounds normal. No stridor. No respiratory distress. He has no wheezes. He has no rales. He exhibits no tenderness.  Abdominal: Soft. Bowel sounds are normal. He exhibits no distension and no mass.  There is no tenderness. There is no rebound and no guarding.  Genitourinary: Rectum normal and penis normal. Guaiac negative stool. No penile tenderness.  1+ symmetrical nonnodular BPH  Musculoskeletal: Normal range of motion. He exhibits no edema and no tenderness.  Lymphadenopathy:    He has no cervical adenopathy.  Neurological: He is alert and oriented to person, place, and time. He has normal reflexes. No cranial nerve deficit. He exhibits normal muscle tone.  Skin: Skin is warm and dry. No rash noted. No erythema. No pallor.  Scar right flank from previous injury when he was a child playing football  Psychiatric: He has a normal mood and affect. His behavior is normal. Judgment and thought content normal.          Assessment & Plan:  Healthy male  Hypertension adult continue current medication  puritis ani,,,,,,,,,,,,,, steroid cream when necessary  Tobacco abuse......... Begin Chantix 1 mg one half tab daily taper program followup in 4 weeks

## 2013-01-19 ENCOUNTER — Other Ambulatory Visit: Payer: Managed Care, Other (non HMO)

## 2013-01-26 ENCOUNTER — Encounter: Payer: Managed Care, Other (non HMO) | Admitting: Family Medicine

## 2013-02-15 ENCOUNTER — Ambulatory Visit: Payer: Managed Care, Other (non HMO) | Admitting: Family Medicine

## 2013-07-13 ENCOUNTER — Other Ambulatory Visit: Payer: Self-pay | Admitting: *Deleted

## 2013-07-13 MED ORDER — HYDROCORTISONE ACE-PRAMOXINE 1-1 % RE CREA: 1.0000 "application " | TOPICAL_CREAM | Freq: Two times a day (BID) | RECTAL | Status: DC

## 2014-06-29 ENCOUNTER — Emergency Department (HOSPITAL_COMMUNITY)
Admission: EM | Admit: 2014-06-29 | Discharge: 2014-06-29 | Disposition: A | Discharge status: Home / Self Care | Payer: Managed Care, Other (non HMO) | Source: Home / Self Care | Attending: Emergency Medicine | Admitting: Emergency Medicine

## 2014-06-29 ENCOUNTER — Encounter (HOSPITAL_COMMUNITY): Payer: Self-pay | Admitting: Emergency Medicine

## 2014-06-29 DIAGNOSIS — J069 Acute upper respiratory infection, unspecified: Secondary | ICD-10-CM

## 2014-06-29 LAB — POCT RAPID STREP A: Streptococcus, Group A Screen (Direct): NEGATIVE

## 2014-06-29 MED ORDER — HYDROCOD POLST-CHLORPHEN POLST 10-8 MG/5ML PO LQCR: 5.0000 mL | Freq: Two times a day (BID) | ORAL | Status: DC | PRN

## 2014-06-29 NOTE — ED Provider Notes (Signed)
CSN: [id number]     Arrival date & time 06/29/14  1900 History   First MD Initiated Contact with Patient 06/29/14 1949     Chief Complaint  Patient presents with  . Cough  . Sore Throat   (Consider location/radiation/quality/duration/timing/severity/associated sxs/prior Treatment) HPI Comments: Pt reports having a cold for last 9 days, most symptoms have resolved but still has cough and sore throat, mild congestion. Denies feeling like chest is congested, no SOB, no wheezing.   Patient is a 53 y.o. male presenting with cough and pharyngitis. The history is provided by the patient.  Cough Cough characteristics:  Non-productive Severity:  Moderate Onset quality:  Gradual Duration:  9 days Timing:  Intermittent Progression:  Unchanged Chronicity:  New Context: upper respiratory infection   Relieved by:  None tried Worsened by:  Nothing tried Ineffective treatments:  None tried Associated symptoms: rhinorrhea, sinus congestion and sore throat   Associated symptoms: no chills, no fever, no shortness of breath and no wheezing   Sore Throat Pertinent negatives include no shortness of breath.    Past Medical History  Diagnosis Date  . Hypertension   . Pain in lower back     epidural for pain orthopedics   Past Surgical History  Procedure Laterality Date  . Circumcision    . Tonsillectomy    . Wisdom tooth extraction    . Right side repair      as child from football injury   Family History  Problem Relation Age of Onset  . Diabetes Mother   . Heart disease Father   . Heart disease Brother    History  Substance Use Topics  . Smoking status: Current Every Day Smoker -- 20 years    Types: Cigarettes  . Smokeless tobacco: Never Used     Comment: less than 1/2 pack per day  . Alcohol Use: No    Review of Systems  Constitutional: Negative for fever and chills.  HENT: Positive for congestion, postnasal drip, rhinorrhea and sore throat. Negative for sinus pressure.    Respiratory: Positive for cough. Negative for shortness of breath and wheezing.     Allergies  Review of patient's allergies indicates no known allergies.  Home Medications   Prior to Admission medications   Medication Sig Start Date End Date Taking? Authorizing Provider  chlorpheniramine-HYDROcodone (TUSSIONEX PENNKINETIC ER) 10-8 MG/5ML LQCR Take 5 mLs by mouth every 12 (twelve) hours as needed for cough. 06/29/14   Carvel Getting, NP  lisinopril (PRINIVIL,ZESTRIL) 10 MG tablet Take 1 tablet (10 mg total) by mouth daily. 01/14/13   Dorena Cookey, MD  pramoxine-hydrocortisone (PROCTOCREAM-HC) 1-1 % rectal cream Place 1 application rectally 2 (two) times daily. 07/13/13   Dorena Cookey, MD  varenicline (CHANTIX CONTINUING MONTH PAK) 1 MG tablet 1 by mouth every morning 01/14/13   Dorena Cookey, MD   BP 158/94 mmHg  Pulse 64  Temp(Src) 98.3 F (36.8 C) (Oral)  Resp 16  SpO2 97% Physical Exam  Constitutional: He appears well-developed and well-nourished. No distress.  HENT:  Right Ear: Tympanic membrane, external ear and ear canal normal.  Left Ear: Tympanic membrane, external ear and ear canal normal.  Nose: Mucosal edema and rhinorrhea present. Right sinus exhibits no maxillary sinus tenderness and no frontal sinus tenderness. Left sinus exhibits no maxillary sinus tenderness and no frontal sinus tenderness.  Mouth/Throat: Oropharynx is clear and moist and mucous membranes are normal.  Cardiovascular: Normal rate and regular rhythm.   Pulmonary/Chest:  Effort normal and breath sounds normal.  Lymphadenopathy:       Head (right side): No submental, no submandibular and no tonsillar adenopathy present.       Head (left side): No submental, no submandibular and no tonsillar adenopathy present.    He has no cervical adenopathy.    ED Course  Procedures (including critical care time) Labs Review Labs Reviewed  POCT RAPID STREP A ([facility] CARE ONLY)    Imaging Review No results  found.   MDM   1. URI (upper respiratory infection)   Strep negative. Pt reports cough is most bothersome sx and requests tussionex cough syrup. Rx tussionex pennkinetic 49mL q12 hrs prn cough #31mL. Suggested saline nasal spray and coricidin cold medicine to manage other sx.      Carvel Getting, NP 06/29/14 [phone]

## 2014-06-29 NOTE — Discharge Instructions (Signed)
Try saline nasal spray and Coricidin cold medicine to help your symptoms.    Upper Respiratory Infection, Adult An upper respiratory infection (URI) is also sometimes known as the common cold. The upper respiratory tract includes the nose, sinuses, throat, trachea, and bronchi. Bronchi are the airways leading to the lungs. Most people improve within 1 week, but symptoms can last up to 2 weeks. A residual cough may last even longer.  CAUSES Many different viruses can infect the tissues lining the upper respiratory tract. The tissues become irritated and inflamed and often become very moist. Mucus production is also common. A cold is contagious. You can easily spread the virus to others by oral contact. This includes kissing, sharing a glass, coughing, or sneezing. Touching your mouth or nose and then touching a surface, which is then touched by another person, can also spread the virus. SYMPTOMS  Symptoms typically develop 1 to 3 days after you come in contact with a cold virus. Symptoms vary from person to person. They may include:  Runny nose.  Sneezing.  Nasal congestion.  Sinus irritation.  Sore throat.  Loss of voice (laryngitis).  Cough.  Fatigue.  Muscle aches.  Loss of appetite.  Headache.  Low-grade fever. DIAGNOSIS  You might diagnose your own cold based on familiar symptoms, since most people get a cold 2 to 3 times a year. Your caregiver can confirm this based on your exam. Most importantly, your caregiver can check that your symptoms are not due to another disease such as strep throat, sinusitis, pneumonia, asthma, or epiglottitis. Blood tests, throat tests, and X-rays are not necessary to diagnose a common cold, but they may sometimes be helpful in excluding other more serious diseases. Your caregiver will decide if any further tests are required. RISKS AND COMPLICATIONS  You may be at risk for a more severe case of the common cold if you smoke cigarettes, have  chronic heart disease (such as heart failure) or lung disease (such as asthma), or if you have a weakened immune system. The very young and very old are also at risk for more serious infections. Bacterial sinusitis, middle ear infections, and bacterial pneumonia can complicate the common cold. The common cold can worsen asthma and chronic obstructive pulmonary disease (COPD). Sometimes, these complications can require emergency medical care and may be life-threatening. PREVENTION  The best way to protect against getting a cold is to practice good hygiene. Avoid oral or hand contact with people with cold symptoms. Wash your hands often if contact occurs. There is no clear evidence that vitamin C, vitamin E, echinacea, or exercise reduces the chance of developing a cold. However, it is always recommended to get plenty of rest and practice good nutrition. TREATMENT  Treatment is directed at relieving symptoms. There is no cure. Antibiotics are not effective, because the infection is caused by a virus, not by bacteria. Treatment may include:  Increased fluid intake. Sports drinks offer valuable electrolytes, sugars, and fluids.  Breathing heated mist or steam (vaporizer or shower).  Eating chicken soup or other clear broths, and maintaining good nutrition.  Getting plenty of rest.  Using gargles or lozenges for comfort.  Controlling fevers with ibuprofen or acetaminophen as directed by your caregiver.  Increasing usage of your inhaler if you have asthma. Zinc gel and zinc lozenges, taken in the first 24 hours of the common cold, can shorten the duration and lessen the severity of symptoms. Pain medicines may help with fever, muscle aches, and throat pain.  A variety of non-prescription medicines are available to treat congestion and runny nose. Your caregiver can make recommendations and may suggest nasal or lung inhalers for other symptoms.  HOME CARE INSTRUCTIONS   Only take over-the-counter or  prescription medicines for pain, discomfort, or fever as directed by your caregiver.  Use a warm mist humidifier or inhale steam from a shower to increase air moisture. This may keep secretions moist and make it easier to breathe.  Drink enough water and fluids to keep your urine clear or pale yellow.  Rest as needed.  Return to work when your temperature has returned to normal or as your caregiver advises. You may need to stay home longer to avoid infecting others. You can also use a face mask and careful hand washing to prevent spread of the virus. SEEK MEDICAL CARE IF:   After the first few days, you feel you are getting worse rather than better.  You need your caregiver's advice about medicines to control symptoms.  You develop chills, worsening shortness of breath, or brown or red sputum. These may be signs of pneumonia.  You develop yellow or brown nasal discharge or pain in the face, especially when you bend forward. These may be signs of sinusitis.  You develop a fever, swollen neck glands, pain with swallowing, or white areas in the back of your throat. These may be signs of strep throat. SEEK IMMEDIATE MEDICAL CARE IF:   You have a fever.  You develop severe or persistent headache, ear pain, sinus pain, or chest pain.  You develop wheezing, a prolonged cough, cough up blood, or have a change in your usual mucus (if you have chronic lung disease).  You develop sore muscles or a stiff neck. Document Released: 01/21/2001 Document Revised: 10/20/2011 Document Reviewed: 11/02/2013 [facility] Patient Information 2015 [place], [place]. This information is not intended to replace advice given to you by your health care provider. Make sure you discuss any questions you have with your health care provider.

## 2014-06-29 NOTE — ED Notes (Signed)
Reports earlier in the week experiencing fever, chills, and body aches, which have resolved.   Pt states still having a productive cough with yellow sputum, sore throat, and congestion.  No relief with otc meds.  Denies vomiting and diarrhea.

## 2014-07-01 LAB — CULTURE, GROUP A STREP

## 2014-10-01 ENCOUNTER — Emergency Department (INDEPENDENT_AMBULATORY_CARE_PROVIDER_SITE_OTHER)
Admission: EM | Admit: 2014-10-01 | Discharge: 2014-10-01 | Disposition: A | Discharge status: Home / Self Care | Payer: Managed Care, Other (non HMO) | Source: Home / Self Care | Attending: Family Medicine | Admitting: Family Medicine

## 2014-10-01 ENCOUNTER — Encounter (HOSPITAL_COMMUNITY): Payer: Self-pay | Admitting: Emergency Medicine

## 2014-10-01 DIAGNOSIS — J018 Other acute sinusitis: Secondary | ICD-10-CM

## 2014-10-01 DIAGNOSIS — J111 Influenza due to unidentified influenza virus with other respiratory manifestations: Secondary | ICD-10-CM | POA: Diagnosis not present

## 2014-10-01 DIAGNOSIS — R69 Illness, unspecified: Principal | ICD-10-CM

## 2014-10-01 MED ORDER — AZITHROMYCIN 250 MG PO TABS: ORAL_TABLET | ORAL | Status: DC

## 2014-10-01 MED ORDER — HYDROCOD POLST-CHLORPHEN POLST 10-8 MG/5ML PO LQCR: 5.0000 mL | Freq: Two times a day (BID) | ORAL | Status: DC | PRN

## 2014-10-01 MED ORDER — IPRATROPIUM BROMIDE 0.06 % NA SOLN: 2.0000 | NASAL | Status: DC | PRN

## 2014-10-01 NOTE — ED Notes (Signed)
Pt states that he has had body aches since Friday 09/29/2014. Pt states that he feels he has the flu

## 2014-10-01 NOTE — ED Provider Notes (Signed)
CSN: [id number]     Arrival date & time 10/01/14  1748 History   First MD Initiated Contact with Patient 10/01/14 1828     Chief Complaint  Patient presents with  . Chills  . Generalized Body Aches   (Consider location/radiation/quality/duration/timing/severity/associated sxs/prior Treatment) HPI          54 year old male presents complaining of body aches, chills, sinus pressure, cough. Symptoms started on Friday. He initially just had body aches, and sinus pressure. The cough started yesterday. It was worse at home, it is not as bad now. He is taking Mucinex with some relief. No NVD. No chest pain or shortness of breath. No recent travel or sick contacts. Cough is dry, nonproductive.  Past Medical History  Diagnosis Date  . Hypertension   . Pain in lower back     epidural for pain orthopedics   Past Surgical History  Procedure Laterality Date  . Circumcision    . Tonsillectomy    . Wisdom tooth extraction    . Right side repair      as child from football injury   Family History  Problem Relation Age of Onset  . Diabetes Mother   . Heart disease Father   . Heart disease Brother    History  Substance Use Topics  . Smoking status: Current Every Day Smoker -- 0.25 packs/day for 20 years    Types: Cigarettes  . Smokeless tobacco: Never Used     Comment: less than 1/2 pack per day  . Alcohol Use: No    Review of Systems  Constitutional: Positive for chills. Negative for fever.  HENT: Positive for congestion, rhinorrhea, sinus pressure and sore throat. Negative for ear pain.   Respiratory: Positive for cough. Negative for shortness of breath.   Cardiovascular: Negative for chest pain.  Gastrointestinal: Negative for nausea, vomiting, abdominal pain and diarrhea.  All other systems reviewed and are negative.   Allergies  Review of patient's allergies indicates no known allergies.  Home Medications   Prior to Admission medications   Medication Sig Start Date End Date  Taking? Authorizing Provider  lisinopril (PRINIVIL,ZESTRIL) 10 MG tablet Take 1 tablet (10 mg total) by mouth daily. 01/14/13  Yes Dorena Cookey, MD  azithromycin (ZITHROMAX Z-PAK) 250 MG tablet Use as directed 10/01/14   Liam Graham, PA-C  chlorpheniramine-HYDROcodone (TUSSIONEX PENNKINETIC ER) 10-8 MG/5ML LQCR Take 5 mLs by mouth every 12 (twelve) hours as needed for cough. 06/29/14   Carvel Getting, NP  chlorpheniramine-HYDROcodone (TUSSIONEX PENNKINETIC ER) 10-8 MG/5ML LQCR Take 5 mLs by mouth every 12 (twelve) hours as needed for cough. 10/01/14   Freeman Caldron Brookelynne Dimperio, PA-C  ipratropium (ATROVENT) 0.06 % nasal spray Place 2 sprays into both nostrils every 4 (four) hours as needed for rhinitis. 10/01/14   Liam Graham, PA-C  pramoxine-hydrocortisone (PROCTOCREAM-HC) 1-1 % rectal cream Place 1 application rectally 2 (two) times daily. 07/13/13   Dorena Cookey, MD  varenicline (CHANTIX CONTINUING MONTH PAK) 1 MG tablet 1 by mouth every morning 01/14/13   Dorena Cookey, MD   BP 158/97 mmHg  Pulse 75  Temp(Src) 99.3 F (37.4 C) (Oral)  Resp 16  SpO2 96% Physical Exam  Constitutional: He is oriented to person, place, and time. He appears well-developed and well-nourished. No distress.  HENT:  Head: Normocephalic and atraumatic.  Right Ear: External ear normal.  Left Ear: External ear normal.  Nose: Right sinus exhibits maxillary sinus tenderness and frontal sinus tenderness. Left  sinus exhibits maxillary sinus tenderness and frontal sinus tenderness.  Mouth/Throat: Uvula is midline, oropharynx is clear and moist and mucous membranes are normal. No oropharyngeal exudate.  Eyes: Conjunctivae are normal. Right eye exhibits no discharge. Left eye exhibits no discharge.  Neck: Normal range of motion. Neck supple.  Cardiovascular: Normal rate, regular rhythm and normal heart sounds.   Pulmonary/Chest: Effort normal and breath sounds normal. No respiratory distress.  Lymphadenopathy:    He has no  cervical adenopathy.  Neurological: He is alert and oriented to person, place, and time. Coordination normal.  Skin: Skin is warm and dry. No rash noted. He is not diaphoretic.  Psychiatric: He has a normal mood and affect. Judgment normal.  Nursing note and vitals reviewed.   ED Course  Procedures (including critical care time) Labs Review Labs Reviewed - No data to display  Imaging Review No results found.   MDM   1. Influenza-like illness   2. Other acute sinusitis    Influenza-like illness. He is outside the window to have any benefit from Tamiflu. Treat symptomatically with Tussionex and Atrovent nasal spray. He will also use ibuprofen for the body aches. I will give him a prescription for azithromycin to take if he continues to worsen, otherwise just symptomatic management. Work note provided   Meds ordered this encounter  Medications  . chlorpheniramine-HYDROcodone (TUSSIONEX PENNKINETIC ER) 10-8 MG/5ML LQCR    Sig: Take 5 mLs by mouth every 12 (twelve) hours as needed for cough.    Dispense:  115 mL    Refill:  0  . ipratropium (ATROVENT) 0.06 % nasal spray    Sig: Place 2 sprays into both nostrils every 4 (four) hours as needed for rhinitis.    Dispense:  15 mL    Refill:  1  . azithromycin (ZITHROMAX Z-PAK) 250 MG tablet    Sig: Use as directed    Dispense:  6 each    Refill:  0       Liam Graham, PA-C 10/01/14 1858

## 2014-10-01 NOTE — Discharge Instructions (Signed)

## 2014-11-21 ENCOUNTER — Ambulatory Visit (INDEPENDENT_AMBULATORY_CARE_PROVIDER_SITE_OTHER): Payer: Managed Care, Other (non HMO) | Admitting: Family Medicine

## 2014-11-21 VITALS — BP 138/90 | HR 72 | Temp 97.4°F | Resp 16 | Ht 74.0 in | Wt 199.0 lb

## 2014-11-21 DIAGNOSIS — R1084 Generalized abdominal pain: Secondary | ICD-10-CM | POA: Diagnosis not present

## 2014-11-21 DIAGNOSIS — R197 Diarrhea, unspecified: Secondary | ICD-10-CM | POA: Diagnosis not present

## 2014-11-21 DIAGNOSIS — R112 Nausea with vomiting, unspecified: Secondary | ICD-10-CM | POA: Diagnosis not present

## 2014-11-21 DIAGNOSIS — J029 Acute pharyngitis, unspecified: Secondary | ICD-10-CM | POA: Diagnosis not present

## 2014-11-21 DIAGNOSIS — K529 Noninfective gastroenteritis and colitis, unspecified: Secondary | ICD-10-CM

## 2014-11-21 LAB — POCT RAPID STREP A (OFFICE): RAPID STREP A SCREEN: NEGATIVE

## 2014-11-21 LAB — POCT CBC
Granulocyte percent: 74 %G (ref 37–80)
HEMATOCRIT: 46.9 % (ref 43.5–53.7)
HEMOGLOBIN: 14.9 g/dL (ref 14.1–18.1)
Lymph, poc: 1.2 (ref 0.6–3.4)
MCH: 28.7 pg (ref 27–31.2)
MCHC: 31.7 g/dL — AB (ref 31.8–35.4)
MCV: 90.4 fL (ref 80–97)
MID (cbc): 0.2 (ref 0–0.9)
MPV: 8.4 fL (ref 0–99.8)
POC GRANULOCYTE: 4 (ref 2–6.9)
POC LYMPH %: 22.7 % (ref 10–50)
POC MID %: 3.3 %M (ref 0–12)
Platelet Count, POC: 146 10*3/uL (ref 142–424)
RBC: 5.19 M/uL (ref 4.69–6.13)
RDW, POC: 15.7 %
WBC: 5.4 10*3/uL (ref 4.6–10.2)

## 2014-11-21 MED ORDER — ACETAMINOPHEN 325 MG PO TABS
1000.0000 mg | ORAL_TABLET | Freq: Once | ORAL | Status: AC
  Administered 2014-11-21: 975 mg via ORAL

## 2014-11-21 MED ORDER — ONDANSETRON 4 MG PO TBDP
4.0000 mg | ORAL_TABLET | Freq: Once | ORAL | Status: AC
  Administered 2014-11-21: 4 mg via ORAL

## 2014-11-21 MED ORDER — ONDANSETRON 4 MG PO TBDP: 4.0000 mg | ORAL_TABLET | Freq: Three times a day (TID) | ORAL | Status: DC | PRN

## 2014-11-21 NOTE — Progress Notes (Signed)
Subjective: 55 year old man who woke up in the night with a taste in his mouth. He got up and vomited. He had diarrhea. He got up a second time later in the night and vomited again. He has had diarrhea multiple times today. He is continued to drink some liquids at but has not eaten anything all day. He did drink some Gatorade. He has had generalized abdominal pain. He did have cramping. He thought he had a fever. He has not been exposed to anyone sick except his daughter who had strep last week. He did miss work today. He generally is pretty healthy. The throat is a little bit sore now. He aches all over  Objective: TMs are normal. Throat clear. Neck supple without nodes. Chest clear. Heart regular without murmur. Abdomen soft without organomegaly, masses, but is mildly generally tender, more the left lower quadrant. Bowel sounds are active.  Assessment: Gastroenteritis Nausea and vomiting Myalgias Diarrhea Abdominal pain Sore throat  Plan: CBC and strep test Zofran Tylenol  Results for orders placed or performed in visit on 11/21/14  POCT CBC  Result Value Ref Range   WBC 5.4 4.6 - 10.2 K/uL   Lymph, poc 1.2 0.6 - 3.4   POC LYMPH PERCENT 22.7 10 - 50 %L   MID (cbc) 0.2 0 - 0.9   POC MID % 3.3 0 - 12 %M   POC Granulocyte 4.0 2 - 6.9   Granulocyte percent 74.0 37 - 80 %G   RBC 5.19 4.69 - 6.13 M/uL   Hemoglobin 14.9 14.1 - 18.1 g/dL   HCT, POC 46.9 43.5 - 53.7 %   MCV 90.4 80 - 97 fL   MCH, POC 28.7 27 - 31.2 pg   MCHC 31.7 (A) 31.8 - 35.4 g/dL   RDW, POC 15.7 %   Platelet Count, POC 146 142 - 424 K/uL   MPV 8.4 0 - 99.8 fL  POCT rapid strep A  Result Value Ref Range   Rapid Strep A Screen Negative Negative

## 2014-11-21 NOTE — Patient Instructions (Signed)
Drink plenty of fluids but no milk or alcohol. It is very important to avoid getting dehydrated.  Get rest  Return if worse  This is probably normal virus or one of the similar intestinal viruses.  Norovirus Infection Norovirus illness is caused by a viral infection. The term norovirus refers to a group of viruses. Any of those viruses can cause norovirus illness. This illness is often referred to by other names such as viral gastroenteritis, stomach flu, and food poisoning. Anyone can get a norovirus infection. People can have the illness multiple times during their lifetime. CAUSES  Norovirus is found in the stool or vomit of infected people. It is easily spread from person to person (contagious). People with norovirus are contagious from the moment they begin feeling ill. They may remain contagious for as long as 3 days to 2 weeks after recovery. People can become infected with the virus in several ways. This includes:  Eating food or drinking liquids that are contaminated with norovirus.  Touching surfaces or objects contaminated with norovirus, and then placing your hand in your mouth.  Having direct contact with a person who is infected and shows symptoms. This may occur while caring for someone with illness or while sharing foods or eating utensils with someone who is ill. SYMPTOMS  Symptoms usually begin 1 to 2 days after ingestion of the virus. Symptoms may include:  Nausea.  Vomiting.  Diarrhea.  Stomach cramps.  Low-grade fever.  Chills.  Headache.  Muscle aches.  Tiredness. Most people with norovirus illness get better within 1 to 2 days. Some people become dehydrated because they cannot drink enough liquids to replace those lost from vomiting and diarrhea. This is especially true for young children, the elderly, and others who are unable to care for themselves. DIAGNOSIS  Diagnosis is based on your symptoms and exam. Currently, only state public health  laboratories have the ability to test for norovirus in stool or vomit. TREATMENT  No specific treatment exists for norovirus infections. No vaccine is available to prevent infections. Norovirus illness is usually brief in healthy people. If you are ill with vomiting and diarrhea, you should drink enough water and fluids to keep your urine clear or pale yellow. Dehydration is the most serious health effect that can result from this infection. By drinking oral rehydration solution (ORS), people can reduce their chance of becoming dehydrated. There are many commercially available pre-made and powdered ORS designed to safely rehydrate people. These may be recommended by your caregiver. Replace any new fluid losses from diarrhea or vomiting with ORS as follows:  If your child weighs 10 kg or less (22 lb or less), give 60 to 120 ml ( to  cup or 2 to 4 oz) of ORS for each diarrheal stool or vomiting episode.  If your child weighs more than 10 kg (more than 22 lb), give 120 to 240 ml ( to 1 cup or 4 to 8 oz) of ORS for each diarrheal stool or vomiting episode. HOME CARE INSTRUCTIONS   Follow all your caregiver's instructions.  Avoid sugar-free and alcoholic drinks while ill.  Only take over-the-counter or prescription medicines for pain, vomiting, diarrhea, or fever as directed by your caregiver. You can decrease your chances of coming in contact with norovirus or spreading it by following these steps:  Frequently wash your hands, especially after using the toilet, changing diapers, and before eating or preparing food.  Carefully wash fruits and vegetables. Cook shellfish before eating them.  Do  not prepare food for others while you are infected and for at least 3 days after recovering from illness.  Thoroughly clean and disinfect contaminated surfaces immediately after an episode of illness using a bleach-based household cleaner.  Immediately remove and wash clothing or linens that may be  contaminated with the virus.  Use the toilet to dispose of any vomit or stool. Make sure the surrounding area is kept clean.  Food that may have been contaminated by an ill person should be discarded. SEEK IMMEDIATE MEDICAL CARE IF:   You develop symptoms of dehydration that do not improve with fluid replacement. This may include:  Excessive sleepiness.  Lack of tears.  Dry mouth.  Dizziness when standing.  Weak pulse. Document Released: 10/18/2002 Document Revised: 10/20/2011 Document Reviewed: 11/19/2009 [facility] Patient Information 2015 [place], [place]. This information is not intended to replace advice given to you by your health care provider. Make sure you discuss any questions you have with your health care provider.

## 2015-04-21 ENCOUNTER — Ambulatory Visit (INDEPENDENT_AMBULATORY_CARE_PROVIDER_SITE_OTHER): Payer: Managed Care, Other (non HMO) | Admitting: Family Medicine

## 2015-04-21 VITALS — BP 116/82 | HR 75 | Temp 97.6°F | Resp 16 | Ht 74.5 in | Wt 201.0 lb

## 2015-04-21 DIAGNOSIS — H00016 Hordeolum externum left eye, unspecified eyelid: Secondary | ICD-10-CM | POA: Diagnosis not present

## 2015-04-21 MED ORDER — TOBRAMYCIN 0.3 % OP SOLN: 1.0000 [drp] | Freq: Four times a day (QID) | OPHTHALMIC | Status: DC

## 2015-04-21 MED ORDER — DOXYCYCLINE HYCLATE 100 MG PO TABS: 100.0000 mg | ORAL_TABLET | Freq: Two times a day (BID) | ORAL | Status: DC

## 2015-04-21 NOTE — Patient Instructions (Signed)

## 2015-04-21 NOTE — Progress Notes (Signed)
@UMFCLOGO @  This chart was scribed for Carlos Haber, MD by Thea Alken, ED Scribe. This patient was seen in room 1 and the patient's care was started at 11:04 AM.  Patient ID: Carlos Walker MRN: [id number], DOB: 04-Nov-1960, 54 y.o. Date of Encounter: 04/21/2015, 11:02 AM  Primary Physician: Carlos Man, MD  Chief Complaint:  Chief Complaint  Patient presents with  . Eye Problem    HPI: 54 y.o. year old male with history below presents with left eye swelling, rednessand soreness that began 2 days. Pt states he had similar symptoms 3 week ago but symptoms resolved on its own after doing some warm compresses. He denies feeling a bump to eye lid. Pt works at [employer] and wears safety glasses but reports working in a dusty environment.    Past Medical History  Diagnosis Date  . Hypertension   . Pain in lower back     epidural for pain orthopedics  . Arthritis      Home Meds: Prior to Admission medications   Medication Sig Start Date End Date Taking? Authorizing Provider  lisinopril (PRINIVIL,ZESTRIL) 10 MG tablet Take 1 tablet (10 mg total) by mouth daily. 01/14/13  Yes Dorena Cookey, MD    Allergies: No Known Allergies  Social History   Social History  . Marital Status: Single    Spouse Name: N/A  . Number of Children: N/A  . Years of Education: N/A   Occupational History  . Not on file.   Social History Main Topics  . Smoking status: Current Every Day Smoker -- 0.25 packs/day for 20 years    Types: Cigarettes  . Smokeless tobacco: Never Used     Comment: less than 1/2 pack per day  . Alcohol Use: No  . Drug Use: No  . Sexual Activity: Not on file   Other Topics Concern  . Not on file   Social History Narrative     Review of Systems: Constitutional: negative for chills, fever, night sweats, weight changes, or fatigue  HEENT: negative for vision changes, hearing loss, congestion, rhinorrhea, ST, epistaxis, or sinus pressure Cardiovascular:  negative for chest pain or palpitations Respiratory: negative for hemoptysis, wheezing, shortness of breath, or cough Abdominal: negative for abdominal pain, nausea, vomiting, diarrhea, or constipation Dermatological: negative for rash Neurologic: negative for headache, dizziness, or syncope All other systems reviewed and are otherwise negative with the exception to those above and in the HPI.   Physical Exam: Blood pressure 116/82, pulse 75, temperature 97.6 F (36.4 C), temperature source Oral, resp. rate 16, height 6' 2.5" (1.892 m), weight 201 lb (91.173 kg), SpO2 98 %., Body mass index is 25.47 kg/(m^2). General: Well developed, well nourished, in no acute distress. Head: Normocephalic, atraumatic, eyes without discharge, sclera non-icteric, injected conjunctiva on left. Swelling on lower lid with palpable subcutaneous 30mm nodule. nares are without discharge. Bilateral auditory canals clear, TM's are without perforation, pearly grey and translucent with reflective cone of light bilaterally. Oral cavity moist, posterior pharynx without exudate, erythema, peritonsillar abscess, or post nasal drip.   Neck: Supple. No thyromegaly. Full ROM. No lymphadenopathy. Lungs: Clear bilaterally to auscultation without wheezes, rales, or rhonchi. Breathing is unlabored. Heart: RRR with S1 S2. No murmurs, rubs, or gallops appreciated. Abdomen: Soft, non-tender, non-distended with normoactive bowel sounds. No hepatomegaly. No rebound/guarding. No obvious abdominal masses. Msk:  Strength and tone normal for age. Extremities/Skin: Warm and dry. No clubbing or cyanosis. No edema. No rashes or suspicious lesions. Neuro: Alert and  oriented X 3. Moves all extremities spontaneously. Gait is normal. CNII-XII grossly in tact. Psych:  Responds to questions appropriately with a normal affect.    ASSESSMENT AND PLAN:  54 y.o. year old male with stye, left side This chart was scribed in my presence and reviewed by  me personally.    ICD-9-CM ICD-10-CM   1. Stye external, left 373.11 H00.016 tobramycin (TOBREX) 0.3 % ophthalmic solution     doxycycline (VIBRA-TABS) 100 MG tablet     Signed, Carlos Haber, MD    Signed, Carlos Haber, MD 04/21/2015 11:02 AM

## 2016-09-18 ENCOUNTER — Encounter: Payer: Self-pay | Admitting: Family Medicine

## 2016-09-18 ENCOUNTER — Ambulatory Visit (INDEPENDENT_AMBULATORY_CARE_PROVIDER_SITE_OTHER): Payer: Managed Care, Other (non HMO) | Admitting: Family Medicine

## 2016-09-18 VITALS — BP 140/98 | HR 62 | Resp 12 | Ht 74.5 in | Wt 208.5 lb

## 2016-09-18 DIAGNOSIS — R350 Frequency of micturition: Secondary | ICD-10-CM | POA: Diagnosis not present

## 2016-09-18 DIAGNOSIS — E785 Hyperlipidemia, unspecified: Secondary | ICD-10-CM

## 2016-09-18 DIAGNOSIS — R1031 Right lower quadrant pain: Secondary | ICD-10-CM | POA: Diagnosis not present

## 2016-09-18 DIAGNOSIS — I10 Essential (primary) hypertension: Secondary | ICD-10-CM

## 2016-09-18 DIAGNOSIS — N400 Enlarged prostate without lower urinary tract symptoms: Secondary | ICD-10-CM | POA: Insufficient documentation

## 2016-09-18 DIAGNOSIS — F17200 Nicotine dependence, unspecified, uncomplicated: Secondary | ICD-10-CM | POA: Diagnosis not present

## 2016-09-18 DIAGNOSIS — K5909 Other constipation: Secondary | ICD-10-CM | POA: Insufficient documentation

## 2016-09-18 DIAGNOSIS — L290 Pruritus ani: Secondary | ICD-10-CM

## 2016-09-18 DIAGNOSIS — N401 Enlarged prostate with lower urinary tract symptoms: Secondary | ICD-10-CM

## 2016-09-18 LAB — URINALYSIS, ROUTINE W REFLEX MICROSCOPIC
Bilirubin Urine: NEGATIVE
HGB URINE DIPSTICK: NEGATIVE
Ketones, ur: NEGATIVE
Leukocytes, UA: NEGATIVE
NITRITE: NEGATIVE
PH: 6 (ref 5.0–8.0)
Specific Gravity, Urine: 1.025 (ref 1.000–1.030)
TOTAL PROTEIN, URINE-UPE24: NEGATIVE
URINE GLUCOSE: NEGATIVE
Urobilinogen, UA: 0.2 (ref 0.0–1.0)

## 2016-09-18 LAB — HEMOGLOBIN A1C: Hgb A1c MFr Bld: 6.5 % (ref 4.6–6.5)

## 2016-09-18 LAB — BASIC METABOLIC PANEL
BUN: 20 mg/dL (ref 6–23)
CHLORIDE: 107 meq/L (ref 96–112)
CO2: 30 mEq/L (ref 19–32)
Calcium: 9.5 mg/dL (ref 8.4–10.5)
Creatinine, Ser: 1.37 mg/dL (ref 0.40–1.50)
GFR: 69.08 mL/min (ref >60.00)
Glucose, Bld: 96 mg/dL (ref 70–99)
POTASSIUM: 4.2 meq/L (ref 3.5–5.1)
Sodium: 141 mEq/L (ref 135–145)

## 2016-09-18 LAB — TSH: TSH: 0.89 u[IU]/mL (ref 0.35–4.50)

## 2016-09-18 LAB — PSA: PSA: 1.39 ng/mL (ref 0.10–4.00)

## 2016-09-18 MED ORDER — LISINOPRIL 10 MG PO TABS: 10.0000 mg | ORAL_TABLET | Freq: Every day | ORAL | 1 refills | Status: DC

## 2016-09-18 MED ORDER — HYDROCORTISONE 1 % RE CREA: 1.0000 "application " | TOPICAL_CREAM | Freq: Every day | RECTAL | 1 refills | Status: DC | PRN

## 2016-09-18 NOTE — Progress Notes (Signed)
HPI:   Carlos Walker is a 56 y.o. male, who is here today to establish care with me. He was assuming today's visit will be a CPE but he has a long list of complaints today, so we agree on addressing some of those and plan on having a CPE later one.   Former PCP: Dr Sherren Mocha Last preventive routine visit: 3-4 years.  He tries to follow a healthy diet. He does not exercise regularly.  Chronic medical problems: HTN,tobacco use,and HLD among some.   Concerns today: HTN,urinary frequency and prostate check,smoking cessation options, would like to have labs done, perianal pruritus among some.    Hypertension:   He has not been on Lisinopril 10 mg for the past few months, he ran out of medication.  He has not followed since 2014. No side effects reported.  He has not noted unusual headache, visual changes, exertional chest pain, dyspnea,  focal weakness, or edema.   Lab Results  Component Value Date   CREATININE 1.2 01/10/2013   BUN 21 01/10/2013   NA 140 01/10/2013   K 4.7 01/10/2013   CL 107 01/10/2013   CO2 28 01/10/2013   -RLQ pain and groin, noted initially a few years ago,  sometimes exacerbated by lifting.  Mild, last a few minutes, resolves spontaneously. He denies changes in bowel habits, nausea,or vomiting. No associated gross hematuria or dysuria.  + Urinary frequency and nocturia,  for about 1-2  years and stable. He denies rectal or testicular pain. + Urine stream decreased, no urine dribbling or incontinence. Nocturia 4-5 time per night.  -Intermittent perianal pruritus, no tenderness. Occasionally he has Hx of constipation and straining, he denies dyschezia or hematochezia. He has not tried OTC medication. In the past he has had topical steroid Rx and it helped.  HLD: He is trying to follow low fat diet. He is not on pharmacologic treatment.  Lab Results  Component Value Date   CHOL 214 (H) 01/10/2013   HDL 53.30 01/10/2013   LDLCALC 112  (H) 12/26/2010   LDLDIRECT 154.1 01/10/2013   TRIG 45.0 01/10/2013   CHOLHDL 4 01/10/2013   + Smoker, he would like to try Chantix. His wife quit smoking with this medication. He has decreased smoking, now smoking 2-3 cig/daily. He has not tried pharmacologic treatment for smoking cessation.   Review of Systems  Constitutional: Negative for activity change, appetite change, fatigue, fever and unexpected weight change.  HENT: Negative for nosebleeds, sore throat and trouble swallowing.   Eyes: Negative for pain, redness and visual disturbance.  Respiratory: Negative for cough, shortness of breath and wheezing.   Cardiovascular: Negative for chest pain, palpitations and leg swelling.  Gastrointestinal: Positive for constipation. Negative for abdominal pain, blood in stool, nausea, rectal pain and vomiting.       No changes in bowel habits.  Endocrine: Negative for polydipsia, polyphagia and polyuria.  Genitourinary: Positive for frequency. Negative for decreased urine volume, dysuria, hematuria and testicular pain.  Musculoskeletal: Positive for back pain (occasional). Negative for gait problem and myalgias.  Skin: Negative for rash.  Neurological: Negative for syncope, weakness and headaches.  Hematological: Negative for adenopathy. Does not bruise/bleed easily.  Psychiatric/Behavioral: Positive for sleep disturbance. Negative for confusion. The patient is nervous/anxious.       No current outpatient prescriptions on file prior to visit.   Current Facility-Administered Medications on File Prior to Visit  Medication Dose Route Frequency Provider Last Rate Last Dose  .  0.9 %  sodium chloride infusion  500 mL Intravenous Continuous Gatha Mayer, MD         Past Medical History:  Diagnosis Date  . Arthritis   . Hyperlipidemia   . Hypertension   . Pain in lower back    epidural for pain orthopedics   No Known Allergies  Family History  Problem Relation Age of Onset  .  Diabetes Mother   . Heart disease Mother   . Hyperlipidemia Mother   . Stroke Mother   . Heart disease Father   . Heart disease Brother   . Hyperlipidemia Sister     Social History   Social History  . Marital status: Single    Spouse name: N/A  . Number of children: N/A  . Years of education: N/A   Social History Main Topics  . Smoking status: Current Every Day Smoker    Packs/day: 0.25    Years: 20.00    Types: Cigarettes  . Smokeless tobacco: Never Used     Comment: less than 1/2 pack per day  . Alcohol use No  . Drug use: No  . Sexual activity: Not Asked   Other Topics Concern  . None   Social History Narrative  . None    Vitals:   09/18/16 0903  BP: (!) 140/98  Pulse: 62  Resp: 12   O2 sat 97% at RA. Body mass index is 26.41 kg/m.   Physical Exam  Nursing note and vitals reviewed. Constitutional: He is oriented to person, place, and time. He appears well-developed and well-nourished. No distress.  HENT:  Head: Atraumatic.  Mouth/Throat: Oropharynx is clear and moist and mucous membranes are normal.  Eyes: Conjunctivae and EOM are normal. Pupils are equal, round, and reactive to light.  Neck: No JVD present. No thyroid mass and no thyromegaly present.  Cardiovascular: Normal rate and regular rhythm.   No murmur heard. Pulses:      Dorsalis pedis pulses are 2+ on the right side, and 2+ on the left side.  Respiratory: Effort normal and breath sounds normal. No respiratory distress.  GI: Soft. He exhibits no mass. There is no hepatomegaly. There is no tenderness. Hernia confirmed negative in the left inguinal area.  Genitourinary: Penis normal. Rectal exam shows no external hemorrhoid, no fissure, no tenderness, anal tone normal and guaiac negative stool. Prostate is enlarged (No nodules appreciated.). Prostate is not tender. Right testis shows swelling. Right testis shows no mass and no tenderness. Left testis shows no mass and no tenderness.    Genitourinary Comments: Mild right testicle hydrocele, no tender. I do not appreciate hernia upon scrotal examination but mild bulging appreciate on right inguinal area upon valsalva.   Musculoskeletal: He exhibits no edema.  Lymphadenopathy:    He has no cervical adenopathy.       Right: No inguinal adenopathy present.       Left: No inguinal adenopathy present.  Neurological: He is alert and oriented to person, place, and time. He has normal strength. No cranial nerve deficit. Coordination and gait normal.  Skin: Skin is warm. No erythema.  Psychiatric: His mood appears anxious. Cognition and memory are normal.  Well groomed, good eye contact.      ASSESSMENT AND PLAN:   Carlos Walker was seen today for establish care.  Diagnoses and all orders for this visit:   Lab Results  Component Value Date   CREATININE 1.37 09/18/2016   BUN 20 09/18/2016   NA 141 09/18/2016  K 4.2 09/18/2016   CL 107 09/18/2016   CO2 30 09/18/2016   Lab Results  Component Value Date   PSA 1.39 09/18/2016   PSA 1.01 01/10/2013   PSA 1.04 12/26/2010   Lab Results  Component Value Date   TSH 0.89 09/18/2016   Lab Results  Component Value Date   HGBA1C 6.5 09/18/2016    Right inguinal pain  ? Inguinal hernia. Surgery evaluation recommended, referral placed. Instructed about warning signs. -     Ambulatory referral to General Surgery  Essential hypertension  Not well controlled. Possible complications of elevated BP discussed. Annual eye examination, he is due for one. Monitor BP at home, f/u in 3-4 months.  -     lisinopril (PRINIVIL,ZESTRIL) 10 MG tablet; Take 1 tablet (10 mg total) by mouth daily. -     Basic metabolic panel  Hyperlipidemia, unspecified hyperlipidemia type  He is not fasting today and cannot come back for fasting labs. Low fat diet discussed. Will plan on FLP next OV.  TOBACCO USE  We dicussed treat,met options and length of treatment for each one. He is not  interested in 11 weeks treatment (Chantix and Wellbutrin). We discuss other options as Nicotine and E Cigarette. He is not sure about what he would like to try, he thinks he may be able to quit with no medications, will let me know what he decides to try.   Constipation, chronic  Increased fiber and fluid intake. Guaiac test negative. Instructed about warning signs.  -     TSH  Urinary frequency  Possible causes discussed in detail. Most likely related to BPH.  Further recommendations will be given according to lab results.  -     PSA -     Hemoglobin A1c -     Urinalysis, Routine w reflex microscopic; Future -     Urinalysis, Routine w reflex microscopic  Perianal pruritus  Inspection negative. I do not appreciate hemorrhoids at this time. Avoid constipation. Good hygiene, he could use wipes to clean area after defecation. Small amount of topica steroid once daily as needed.  -     hydrocortisone (PROCTOCORT) 1 % CREA; Apply 1 application topically daily as needed.  Benign prostatic hyperplasia with urinary frequency  Treatment options discussed: Flomax or Doxazocin, some side effects. Flomax is a good options, will hold on medication until labs are back and until he has eye exam to be sure he will not need cataract surgery anytime soon. Recommend avoid fluid intake late afternoon, may help with nocturia.     Face to face 42 min. > 50% was dedicated to discussion of each concerned, reassured about some, education about physical vs follow up visits. Possible complications of some of his medical problems, side effects of medications, smoking cessation options, and plan of care.     Abiha Lukehart G. Martinique, MD  [facility] [place] office.

## 2016-09-18 NOTE — Progress Notes (Signed)
Pre visit review using our clinic review tool, if applicable. No additional management support is needed unless otherwise documented below in the visit note. 

## 2016-09-18 NOTE — Patient Instructions (Addendum)
A few things to remember from today's visit:   TOBACCO USE  Essential hypertension - Plan: lisinopril (PRINIVIL,ZESTRIL) 10 MG tablet, Basic metabolic panel  Hyperlipidemia, unspecified hyperlipidemia type  Constipation, chronic - Plan: TSH  Urinary frequency - Plan: PSA, Hemoglobin A1c, Urinalysis, Routine w reflex microscopic  Right inguinal pain - Plan: Ambulatory referral to General Surgery  Perianal pruritus - Plan: hydrocortisone (PROCTOCORT) 1 % CREA   Please let me know what you decide about Chantix or Nicotine.   Blood pressure goal for most people is less than 140/90. Some populations (older than 60) the goal is less than 150/90.  Most recent cardiologists' recommendations recommend blood pressure at or less than 130/80.   Elevated blood pressure increases the risk of strokes, heart and kidney disease, and eye problems. Regular physical activity and a healthy diet (DASH diet) usually help. Low salt diet. Take medications as instructed.  Caution with some over the counter medications as cold medications, dietary products (for weight loss), and Ibuprofen or Aleve (frequent use);all these medications could cause elevation of blood pressure.    Please be sure medication list is accurate. If a new problem present, please set up appointment sooner than planned today.

## 2016-09-22 ENCOUNTER — Other Ambulatory Visit: Payer: Self-pay | Admitting: Family Medicine

## 2016-09-22 ENCOUNTER — Telehealth: Payer: Self-pay

## 2016-09-22 DIAGNOSIS — F17200 Nicotine dependence, unspecified, uncomplicated: Secondary | ICD-10-CM

## 2016-09-22 MED ORDER — VARENICLINE TARTRATE 1 MG PO TABS: 1.0000 mg | ORAL_TABLET | Freq: Two times a day (BID) | ORAL | 1 refills | Status: DC

## 2016-09-22 MED ORDER — VARENICLINE TARTRATE 0.5 MG X 11 & 1 MG X 42 PO MISC
ORAL | 0 refills | Status: AC
Start: 2016-09-22 — End: 2016-09-29

## 2016-09-22 NOTE — Telephone Encounter (Signed)
Patient is interested in starting the Chantix to help him stop smoking. Okay to send in?

## 2016-09-22 NOTE — Telephone Encounter (Signed)
Rx's sent to pharmacy. Starting pack first week to titrate up dose, then 1 tab bid. Thanks, BJ

## 2016-09-23 NOTE — Telephone Encounter (Signed)
Rx faxed to pharmacy  

## 2017-06-14 ENCOUNTER — Other Ambulatory Visit: Payer: Self-pay | Admitting: Family Medicine

## 2017-06-14 DIAGNOSIS — I10 Essential (primary) hypertension: Secondary | ICD-10-CM

## 2017-09-21 ENCOUNTER — Telehealth: Payer: Self-pay | Admitting: *Deleted

## 2017-09-21 NOTE — Telephone Encounter (Signed)
Copied from [place] [id number]. Topic: Appointment Scheduling - Prior Auth Required for Appointment >> Sep 21, 2017  3:06 PM Vernona Rieger wrote: Patient said he wants to move his care from Dr Martinique to [facility]. Please call patient and sch after approved. Call back is [phone]

## 2017-09-21 NOTE — Telephone Encounter (Signed)
Dr. Martinique, okay to transfer?

## 2017-09-22 NOTE — Telephone Encounter (Signed)
It is Ok to transfer care.

## 2017-09-22 NOTE — Telephone Encounter (Signed)
Dr. Banks, okay to transfer? 

## 2017-09-23 NOTE — Telephone Encounter (Signed)
ok 

## 2017-09-24 ENCOUNTER — Telehealth: Payer: Self-pay | Admitting: Family Medicine

## 2017-09-24 NOTE — Telephone Encounter (Signed)
It is ok to transfer care.

## 2017-09-24 NOTE — Telephone Encounter (Signed)
Copied from [place]. Topic: Inquiry >> Sep 24, 2017  9:26 AM Corie Chiquito, [place] wrote: Reason for CRM: Patient wife called because her husband would like to have a male provider. Stated it is nothing against Dr.Jordan he just prefers a male because he needs to have a CPE completed. I did advise her that the Tommi Rumps is the only male taking on new patients and he is a  Np. She is fine with that. If someone could give her a call back to let her know if he can switch out his PCP at [phone]

## 2017-09-25 NOTE — Telephone Encounter (Signed)
Okay to transfer  

## 2017-09-25 NOTE — Telephone Encounter (Signed)
I am ok with him transferring

## 2017-09-28 NOTE — Telephone Encounter (Signed)
Please contact patient to schedule

## 2017-09-29 ENCOUNTER — Telehealth: Payer: Self-pay | Admitting: Family Medicine

## 2017-09-29 NOTE — Telephone Encounter (Signed)
LMVM for the patient to return call and schedule an establishment appointment with [name]. Transferring care from Betty Martinique to [facility]

## 2017-09-29 NOTE — Telephone Encounter (Signed)
Fine with me

## 2017-10-27 ENCOUNTER — Encounter: Payer: Self-pay | Admitting: Adult Health

## 2017-10-27 ENCOUNTER — Ambulatory Visit (INDEPENDENT_AMBULATORY_CARE_PROVIDER_SITE_OTHER): Payer: 59 | Admitting: Adult Health

## 2017-10-27 VITALS — BP 132/90 | Temp 98.2°F | Wt 213.0 lb

## 2017-10-27 DIAGNOSIS — Z7689 Persons encountering health services in other specified circumstances: Secondary | ICD-10-CM

## 2017-10-27 DIAGNOSIS — N138 Other obstructive and reflux uropathy: Secondary | ICD-10-CM

## 2017-10-27 DIAGNOSIS — I10 Essential (primary) hypertension: Secondary | ICD-10-CM | POA: Diagnosis not present

## 2017-10-27 DIAGNOSIS — N401 Enlarged prostate with lower urinary tract symptoms: Secondary | ICD-10-CM

## 2017-10-27 DIAGNOSIS — R0789 Other chest pain: Secondary | ICD-10-CM

## 2017-10-27 MED ORDER — TAMSULOSIN HCL 0.4 MG PO CAPS: 0.4000 mg | ORAL_CAPSULE | Freq: Every day | ORAL | 3 refills | Status: DC

## 2017-10-27 NOTE — Progress Notes (Signed)
Patient presents to clinic today to establish care. He is a pleasant 57 year old male who  has a past medical history of Arthritis, Hyperlipidemia, Hypertension, and Pain in lower back.  Acute Concerns: Establish Care  Nocturia -patient reports having to get up multiple times throughout the night to urinate.  When he does urinate he feels as though his stream is not as strong as it once was and he often has to stand next to the commode and wait a while before his stream starts.  He also feels as though he is not emptying his bladder completely  Left-sided chest wall pain-has been present for approximately 3 weeks intermittently.  Unable to describe pain at this time.  Denies any radiating pain up the jawline or down the left arm.  Denies any shortness of breath.  Has not had any trauma to the area which would cause the pain.  He does have a long-standing history of heart disease and heart attacks in his family.  Chronic Issues: Hypertension - takes lisinopril 10 mg daily.   BP Readings from Last 3 Encounters:  10/27/17 132/90  09/18/16 (!) 140/98  04/21/15 116/82   Hyperlipidemia - Not currently taking any medication.  Lab Results  Component Value Date   CHOL 214 (H) 01/10/2013   HDL 53.30 01/10/2013   LDLCALC 112 (H) 12/26/2010   LDLDIRECT 154.1 01/10/2013   TRIG 45.0 01/10/2013   CHOLHDL 4 01/10/2013     Health Maintenance: Dental -- Routine Care  Vision --  Routine Care Immunizations -- UTD  Colonoscopy -- 2012 - 10 year plan     Past Medical History:  Diagnosis Date  . Arthritis   . Hyperlipidemia   . Hypertension   . Pain in lower back    epidural for pain orthopedics    Past Surgical History:  Procedure Laterality Date  . CIRCUMCISION    . right side repair     as child from football injury  . TONSILLECTOMY    . WISDOM TOOTH EXTRACTION      Current Outpatient Medications on File Prior to Visit  Medication Sig Dispense Refill  . hydrocortisone  (PROCTOCORT) 1 % CREA Apply 1 application topically daily as needed. (Patient taking differently: Apply 1 application topically daily as needed. PATIENT WANTS NAME BRAND) 28.35 g 1  . lisinopril (PRINIVIL,ZESTRIL) 10 MG tablet TAKE 1 TABLET (10 MG TOTAL) BY MOUTH DAILY. 90 tablet 1   Current Facility-Administered Medications on File Prior to Visit  Medication Dose Route Frequency Provider Last Rate Last Dose  . 0.9 %  sodium chloride infusion  500 mL Intravenous Continuous Gatha Mayer, MD        No Known Allergies  Family History  Problem Relation Age of Onset  . Diabetes Mother   . Heart disease Mother   . Hyperlipidemia Mother   . Stroke Mother   . Heart disease Father   . Heart disease Brother   . Hyperlipidemia Sister     Social History   Socioeconomic History  . Marital status: Single    Spouse name: Not on file  . Number of children: Not on file  . Years of education: Not on file  . Highest education level: Not on file  Social Needs  . Financial resource strain: Not on file  . Food insecurity - worry: Not on file  . Food insecurity - inability: Not on file  . Transportation needs - medical: Not on file  . Transportation  needs - non-medical: Not on file  Occupational History  . Not on file  Tobacco Use  . Smoking status: Former Smoker    Packs/day: 0.25    Years: 20.00    Pack years: 5.00    Types: Cigarettes    Last attempt to quit: 09/20/2016    Years since quitting: 1.1  . Smokeless tobacco: Never Used  . Tobacco comment: less than 1/2 pack per day  Substance and Sexual Activity  . Alcohol use: No  . Drug use: No  . Sexual activity: Not on file  Other Topics Concern  . Not on file  Social History Narrative  . Not on file    Review of Systems  Constitutional: Negative.   HENT: Negative.   Eyes: Negative.   Respiratory: Negative.   Cardiovascular: Positive for chest pain. Negative for palpitations, orthopnea, claudication, leg swelling and PND.    Gastrointestinal: Negative.   Genitourinary: Positive for frequency.  Musculoskeletal: Negative.   Skin: Negative.   Neurological: Negative.   Endo/Heme/Allergies: Negative.   Psychiatric/Behavioral: Negative.   All other systems reviewed and are negative.   BP 132/90 (BP Location: Left Arm)   Temp 98.2 F (36.8 C) (Oral)   Wt 213 lb (96.6 kg)   BMI 26.98 kg/m   Physical Exam  Constitutional: He is oriented to person, place, and time and well-developed, well-nourished, and in no distress. No distress.  Eyes: Conjunctivae and EOM are normal. Pupils are equal, round, and reactive to light. Right eye exhibits no discharge. Left eye exhibits no discharge. No scleral icterus.  Neck: Normal range of motion. Neck supple. No JVD present. No tracheal deviation present. No thyromegaly present.  Cardiovascular: Normal rate, regular rhythm, normal heart sounds and intact distal pulses. Exam reveals no gallop and no friction rub.  No murmur heard. Pulmonary/Chest: Effort normal and breath sounds normal. No stridor. No respiratory distress. He has no wheezes. He has no rales. He exhibits tenderness.  Abdominal: Soft. Bowel sounds are normal. He exhibits no distension and no mass. There is no hepatosplenomegaly, splenomegaly or hepatomegaly. There is tenderness in the epigastric area, periumbilical area and left upper quadrant. There is no rigidity, no rebound, no guarding, no CVA tenderness, no tenderness at McBurney's point and negative Murphy's sign.  Musculoskeletal: Normal range of motion. He exhibits no edema, tenderness or deformity.  Reproducible tenderness with palpation to left chest wall  Lymphadenopathy:    He has no cervical adenopathy.  Neurological: He is alert and oriented to person, place, and time. He has normal reflexes. He displays normal reflexes. No cranial nerve deficit. He exhibits normal muscle tone. Gait normal. Coordination normal. GCS score is 15.  Skin: Skin is warm and  dry. No rash noted. He is not diaphoretic. No erythema. No pallor.  Psychiatric: Mood, memory, affect and judgment normal.  Nursing note and vitals reviewed.    Assessment/Plan: 1. Encounter to establish care -Follow-up for CPE - Work on diet and exercise  2. Chest wall pain  - EKG 12-Lead- NSR - rate 61.  -Does not seem to be cardiac in nature.  Did have chest wall pain but also pain in epigastric and left upper quadrant with palpation.  When I have him try over-the-counter Prilosec for GERD and we will follow-up with this during his CPE  3. BPH with obstruction/lower urinary tract symptoms  - tamsulosin (FLOMAX) 0.4 MG CAPS capsule; Take 1 capsule (0.4 mg total) by mouth daily.  Dispense: 30 capsule; Refill: 3  4. Hypertension, essential - Near goal. Will continue to monitor   [name], NP

## 2017-11-03 ENCOUNTER — Encounter: Payer: Self-pay | Admitting: Adult Health

## 2017-11-03 ENCOUNTER — Ambulatory Visit (INDEPENDENT_AMBULATORY_CARE_PROVIDER_SITE_OTHER): Payer: 59 | Admitting: Adult Health

## 2017-11-03 VITALS — BP 126/86 | Temp 97.6°F | Ht 73.5 in | Wt 214.0 lb

## 2017-11-03 DIAGNOSIS — Z0000 Encounter for general adult medical examination without abnormal findings: Secondary | ICD-10-CM

## 2017-11-03 DIAGNOSIS — R3914 Feeling of incomplete bladder emptying: Secondary | ICD-10-CM

## 2017-11-03 DIAGNOSIS — K219 Gastro-esophageal reflux disease without esophagitis: Secondary | ICD-10-CM | POA: Diagnosis not present

## 2017-11-03 DIAGNOSIS — I10 Essential (primary) hypertension: Secondary | ICD-10-CM | POA: Diagnosis not present

## 2017-11-03 DIAGNOSIS — Z23 Encounter for immunization: Secondary | ICD-10-CM | POA: Diagnosis not present

## 2017-11-03 DIAGNOSIS — Z1159 Encounter for screening for other viral diseases: Secondary | ICD-10-CM | POA: Diagnosis not present

## 2017-11-03 DIAGNOSIS — Z114 Encounter for screening for human immunodeficiency virus [HIV]: Secondary | ICD-10-CM | POA: Diagnosis not present

## 2017-11-03 DIAGNOSIS — N401 Enlarged prostate with lower urinary tract symptoms: Secondary | ICD-10-CM

## 2017-11-03 DIAGNOSIS — E782 Mixed hyperlipidemia: Secondary | ICD-10-CM | POA: Diagnosis not present

## 2017-11-03 LAB — CBC WITH DIFFERENTIAL/PLATELET
BASOS PCT: 0.4 % (ref 0.0–3.0)
Basophils Absolute: 0 10*3/uL (ref 0.0–0.1)
EOS ABS: 0.1 10*3/uL (ref 0.0–0.7)
EOS PCT: 1.1 % (ref 0.0–5.0)
HEMATOCRIT: 41.4 % (ref 39.0–52.0)
HEMOGLOBIN: 13.7 g/dL (ref 13.0–17.0)
LYMPHS PCT: 45.8 % (ref 12.0–46.0)
Lymphs Abs: 2.2 10*3/uL (ref 0.7–4.0)
MCHC: 33.1 g/dL (ref 30.0–36.0)
MCV: 88 fl (ref 78.0–100.0)
Monocytes Absolute: 0.5 10*3/uL (ref 0.1–1.0)
Monocytes Relative: 9.7 % (ref 3.0–12.0)
NEUTROS ABS: 2.1 10*3/uL (ref 1.4–7.7)
Neutrophils Relative %: 43 % (ref 43.0–77.0)
PLATELETS: 197 10*3/uL (ref 150.0–400.0)
RBC: 4.71 Mil/uL (ref 4.22–5.81)
RDW: 14.2 % (ref 11.5–15.5)
WBC: 4.9 10*3/uL (ref 4.0–10.5)

## 2017-11-03 LAB — LIPID PANEL
Cholesterol: 196 mg/dL (ref 0–200)
HDL: 51.6 mg/dL (ref >39.00)
LDL Cholesterol: 134 mg/dL — ABNORMAL HIGH (ref 0–99)
NONHDL: 144.19
TRIGLYCERIDES: 52 mg/dL (ref 0.0–149.0)
Total CHOL/HDL Ratio: 4
VLDL: 10.4 mg/dL (ref 0.0–40.0)

## 2017-11-03 LAB — BASIC METABOLIC PANEL
BUN: 17 mg/dL (ref 6–23)
CO2: 30 meq/L (ref 19–32)
CREATININE: 1.14 mg/dL (ref 0.40–1.50)
Calcium: 9.3 mg/dL (ref 8.4–10.5)
Chloride: 104 mEq/L (ref 96–112)
GFR: 85.06 mL/min (ref >60.00)
Glucose, Bld: 97 mg/dL (ref 70–99)
POTASSIUM: 4.7 meq/L (ref 3.5–5.1)
Sodium: 139 mEq/L (ref 135–145)

## 2017-11-03 LAB — HEPATIC FUNCTION PANEL
ALBUMIN: 4.2 g/dL (ref 3.5–5.2)
ALT: 14 U/L (ref 0–53)
AST: 13 U/L (ref 0–37)
Alkaline Phosphatase: 48 U/L (ref 39–117)
Bilirubin, Direct: 0.1 mg/dL (ref 0.0–0.3)
TOTAL PROTEIN: 6.8 g/dL (ref 6.0–8.3)
Total Bilirubin: 0.6 mg/dL (ref 0.2–1.2)

## 2017-11-03 LAB — TSH: TSH: 1.65 u[IU]/mL (ref 0.35–4.50)

## 2017-11-03 LAB — HEMOGLOBIN A1C: Hgb A1c MFr Bld: 6.5 % (ref 4.6–6.5)

## 2017-11-03 LAB — PSA: PSA: 1.49 ng/mL (ref 0.10–4.00)

## 2017-11-03 MED ORDER — TAMSULOSIN HCL 0.4 MG PO CAPS: 0.4000 mg | ORAL_CAPSULE | Freq: Every day | ORAL | 3 refills | Status: DC

## 2017-11-03 MED ORDER — LISINOPRIL 10 MG PO TABS: 10.0000 mg | ORAL_TABLET | Freq: Every day | ORAL | 3 refills | Status: DC

## 2017-11-03 MED ORDER — OMEPRAZOLE 20 MG PO CPDR: 20.0000 mg | DELAYED_RELEASE_CAPSULE | Freq: Every day | ORAL | 3 refills | Status: DC

## 2017-11-03 NOTE — Progress Notes (Signed)
Subjective:    Patient ID: Carlos Walker, male    DOB: 14-Mar-1961, 57 y.o.   MRN: [id number]  HPI Patient presents for yearly preventative medicine examination. He was seen last week and given a prescription for Flomax.  He says this has helped with his nocturnal voiding.  He is only up 2 to 3 times a night versus 8 to 9.  His stream and urgency has improved.  He was also seen at that point with some left sided reproducible chest pain.  His EKG was normal at that time.  He is still having some left-sided chest pain.  He has been playing catch with his daughter and using his left arm with some over-reaching a over-stretching. Otherwise he is doing well.    All immunizations and health maintenance protocols were reviewed with the patient and needed orders were placed. He is up to date with eye and dental exams. Will get DTaP today.   Appropriate screening laboratory values were ordered for the patient including screening of hyperlipidemia, renal function and hepatic function. If indicated by BPH, a PSA was ordered.  Medication reconciliation,  past medical history, social history, problem list and allergies were reviewed in detail with the patient  Goals were established with regard to weight loss, exercise, and  diet in compliance with medications. He eats a balanced diet, avoiding fast foods, decreasing portion sizes and choosing healthy snacks like nuts. He does not get regular exercise outside of his job.  His employer will pay for a gym membership and he is going to look into this.   End of life planning was discussed. He does not have AD on file.  We did discuss this and he will discuss this with his wife.   Review of Systems  Constitutional: Negative for chills, fatigue and fever.  Respiratory: Positive for chest tightness. Negative for shortness of breath.   Cardiovascular: Positive for chest pain. Negative for palpitations.       Left ribcage area  Gastrointestinal: Positive for  abdominal distention and nausea. Negative for abdominal pain, blood in stool, constipation, diarrhea and vomiting.  Genitourinary: Positive for difficulty urinating, frequency and urgency. Negative for dysuria.       This has improved since starting Flomax  Musculoskeletal: Positive for arthralgias and back pain. Negative for joint swelling.   Past Medical History:  Diagnosis Date  . Arthritis   . Hyperlipidemia   . Hypertension   . Pain in lower back    epidural for pain orthopedics    Social History   Socioeconomic History  . Marital status: Single    Spouse name: Not on file  . Number of children: Not on file  . Years of education: Not on file  . Highest education level: Not on file  Occupational History  . Not on file  Social Needs  . Financial resource strain: Not on file  . Food insecurity:    Worry: Not on file    Inability: Not on file  . Transportation needs:    Medical: Not on file    Non-medical: Not on file  Tobacco Use  . Smoking status: Former Smoker    Packs/day: 0.25    Years: 20.00    Pack years: 5.00    Types: Cigarettes    Last attempt to quit: 09/20/2016    Years since quitting: 1.1  . Smokeless tobacco: Never Used  . Tobacco comment: less than 1/2 pack per day  Substance and Sexual Activity  .  Alcohol use: No  . Drug use: No  . Sexual activity: Not on file  Lifestyle  . Physical activity:    Days per week: Not on file    Minutes per session: Not on file  . Stress: Not on file  Relationships  . Social connections:    Talks on phone: Not on file    Gets together: Not on file    Attends religious service: Not on file    Active member of club or organization: Not on file    Attends meetings of clubs or organizations: Not on file    Relationship status: Not on file  . Intimate partner violence:    Fear of current or ex partner: Not on file    Emotionally abused: Not on file    Physically abused: Not on file    Forced sexual activity: Not  on file  Other Topics Concern  . Not on file  Social History Narrative  . Not on file    Past Surgical History:  Procedure Laterality Date  . CIRCUMCISION    . right side repair     as child from football injury  . TONSILLECTOMY    . WISDOM TOOTH EXTRACTION      Family History  Problem Relation Age of Onset  . Diabetes Mother   . Heart disease Mother   . Hyperlipidemia Mother   . Stroke Mother   . Heart disease Father   . Heart attack Father   . Heart disease Brother   . Renal Disease Brother   . Hyperlipidemia Sister     No Known Allergies  Current Outpatient Medications on File Prior to Visit  Medication Sig Dispense Refill  . hydrocortisone (PROCTOCORT) 1 % CREA Apply 1 application topically daily as needed. (Patient taking differently: Apply 1 application topically daily as needed. PATIENT WANTS NAME BRAND) 28.35 g 1  . lisinopril (PRINIVIL,ZESTRIL) 10 MG tablet TAKE 1 TABLET (10 MG TOTAL) BY MOUTH DAILY. 90 tablet 1  . tamsulosin (FLOMAX) 0.4 MG CAPS capsule Take 1 capsule (0.4 mg total) by mouth daily. 30 capsule 3   Current Facility-Administered Medications on File Prior to Visit  Medication Dose Route Frequency Provider Last Rate Last Dose  . 0.9 %  sodium chloride infusion  500 mL Intravenous Continuous Gatha Mayer, MD        BP 126/86 (BP Location: Left Arm)   Temp 97.6 F (36.4 C) (Oral)   Ht 6' 1.5" (1.867 m)   Wt 214 lb (97.1 kg)   BMI 27.85 kg/m      Objective:   Physical Exam  Constitutional: He is oriented to person, place, and time. He appears well-developed and well-nourished. No distress.  HENT:  Head: Normocephalic and atraumatic.  Right Ear: External ear normal.  Left Ear: External ear normal.  Nose: Nose normal.  Mouth/Throat: Oropharynx is clear and moist. No oropharyngeal exudate.  Eyes: Pupils are equal, round, and reactive to light.  Neck: Neck supple. Carotid bruit is not present. No thyromegaly present.  Cardiovascular:  Normal rate, regular rhythm, normal heart sounds and intact distal pulses. Exam reveals no gallop and no friction rub.  No murmur heard. Pulmonary/Chest: Effort normal and breath sounds normal. No respiratory distress. He has no wheezes. He has no rales.  Abdominal: Soft. Bowel sounds are normal. He exhibits no distension. There is no hepatosplenomegaly. There is tenderness in the epigastric area, left upper quadrant and left lower quadrant. There is rebound. There is no  guarding.  Rebound tenderness in the LLQ  Musculoskeletal: Normal range of motion. He exhibits no edema or tenderness.  Pain in bilateral plantar fascia and heel. Tenderness to palpation.   Lymphadenopathy:    He has no cervical adenopathy.  Neurological: He is alert and oriented to person, place, and time. He displays normal reflexes. Coordination normal.  Skin: Skin is warm and dry. No rash noted. He is not diaphoretic. No erythema. No pallor.  Psychiatric: He has a normal mood and affect. His behavior is normal. Judgment and thought content normal.  Nursing note and vitals reviewed.     Assessment & Plan:  1. Gastroesophageal reflux disease without esophagitis Will consider GI referral if reflux persists.  - omeprazole (PRILOSEC) 20 MG capsule; Take 1 capsule (20 mg total) by mouth daily.  Dispense: 90 capsule; Refill: 3  2. Routine general medical examination at a health care facility Continue diet and start an exercise routine.  Look into a gym membership through your employer. Will check lab work and notify of results.  - Basic metabolic panel - CBC with Differential/Platelet - Hemoglobin A1c - Hepatic function panel - Lipid panel - TSH - PSA - Hep C Antibody - HIV antibody  3. Mixed hyperlipidemia Will check lab work and notify of results.  - Basic metabolic panel - CBC with Differential/Platelet - Hemoglobin A1c - Hepatic function panel - Lipid panel - TSH  4. Hypertension, essential Blood pressure  is well controlled.  Will check lab work and notify of results.  - Basic metabolic panel - CBC with Differential/Platelet - Hemoglobin A1c - Hepatic function panel - Lipid panel - TSH  5. Benign prostatic hyperplasia with incomplete bladder emptying BPH symptoms have improved with Flomax.  - PSA  6. Essential hypertension Blood pressure is well controlled.  - lisinopril (PRINIVIL,ZESTRIL) 10 MG tablet; Take 1 tablet (10 mg total) by mouth daily.  Dispense: 90 tablet; Refill: 3  7. BPH with obstruction/lower urinary tract symptoms BPH symptoms have improved with Flomax.  Will consider referral to urology if symptoms worsen.  - tamsulosin (FLOMAX) 0.4 MG CAPS capsule; Take 1 capsule (0.4 mg total) by mouth daily.  Dispense: 90 capsule; Refill: 3  8. Need for Tdap vaccination - Tdap vaccine greater than or equal to 7yo IM   Follow up as needed Aarion Kittrell C Jakiah Goree BSN RN NP student

## 2017-11-03 NOTE — Progress Notes (Signed)
Subjective:    Patient ID: Carlos Walker, male    DOB: 26-Dec-1960, 57 y.o.   MRN: [id number]  HPI  Patient presents for yearly preventative medicine examination. He is a pleasant 57 year old male who  has a past medical history of Arthritis, Hyperlipidemia, Hypertension, and Pain in lower back.   During his initial visit 10 days ago he was started on Flomax 0.4 mg for BPH issues - he reports that this medication has helped "tremendously".   He was also complaining of chest and epigastric pain he was advised to start OTC Prilosec 20 mg for GERD. He reports that he has not started this medication yet. He continues to have intermittent pain   He is also taking lisinopril 10 mg for hypertension  BP Readings from Last 3 Encounters:  11/03/17 126/86  10/27/17 132/90  09/18/16 (!) 140/98   All immunizations and health maintenance protocols were reviewed with the patient and needed orders were placed.  Appropriate screening laboratory values were ordered for the patient including screening of hyperlipidemia, renal function and hepatic function. If indicated by BPH, a PSA was ordered.  Medication reconciliation,  past medical history, social history, problem list and allergies were reviewed in detail with the patient  Goals were established with regard to weight loss, exercise, and  diet in compliance with medications  Review of Systems  Constitutional: Negative.   HENT: Negative.   Eyes: Negative.   Respiratory: Negative.   Cardiovascular: Negative.   Gastrointestinal: Positive for abdominal pain.  Endocrine: Negative.   Genitourinary: Negative.   Musculoskeletal: Negative.   Skin: Negative.   Neurological: Negative.   Hematological: Negative.   Psychiatric/Behavioral: Negative.   All other systems reviewed and are negative.  Past Medical History:  Diagnosis Date  . Arthritis   . Hyperlipidemia   . Hypertension   . Pain in lower back    epidural for pain orthopedics     Social History   Socioeconomic History  . Marital status: Single    Spouse name: Not on file  . Number of children: Not on file  . Years of education: Not on file  . Highest education level: Not on file  Occupational History  . Not on file  Social Needs  . Financial resource strain: Not on file  . Food insecurity:    Worry: Not on file    Inability: Not on file  . Transportation needs:    Medical: Not on file    Non-medical: Not on file  Tobacco Use  . Smoking status: Former Smoker    Packs/day: 0.25    Years: 20.00    Pack years: 5.00    Types: Cigarettes    Last attempt to quit: 09/20/2016    Years since quitting: 1.1  . Smokeless tobacco: Never Used  . Tobacco comment: less than 1/2 pack per day  Substance and Sexual Activity  . Alcohol use: No  . Drug use: No  . Sexual activity: Not on file  Lifestyle  . Physical activity:    Days per week: Not on file    Minutes per session: Not on file  . Stress: Not on file  Relationships  . Social connections:    Talks on phone: Not on file    Gets together: Not on file    Attends religious service: Not on file    Active member of club or organization: Not on file    Attends meetings of clubs or organizations: Not on  file    Relationship status: Not on file  . Intimate partner violence:    Fear of current or ex partner: Not on file    Emotionally abused: Not on file    Physically abused: Not on file    Forced sexual activity: Not on file  Other Topics Concern  . Not on file  Social History Narrative  . Not on file    Past Surgical History:  Procedure Laterality Date  . CIRCUMCISION    . right side repair     as child from football injury  . TONSILLECTOMY    . WISDOM TOOTH EXTRACTION      Family History  Problem Relation Age of Onset  . Diabetes Mother   . Heart disease Mother   . Hyperlipidemia Mother   . Stroke Mother   . Heart disease Father   . Heart attack Father   . Heart disease Brother   .  Renal Disease Brother   . Hyperlipidemia Sister     No Known Allergies  Current Outpatient Medications on File Prior to Visit  Medication Sig Dispense Refill  . hydrocortisone (PROCTOCORT) 1 % CREA Apply 1 application topically daily as needed. (Patient taking differently: Apply 1 application topically daily as needed. PATIENT WANTS NAME BRAND) 28.35 g 1   Current Facility-Administered Medications on File Prior to Visit  Medication Dose Route Frequency Provider Last Rate Last Dose  . 0.9 %  sodium chloride infusion  500 mL Intravenous Continuous Gatha Mayer, MD        BP 126/86 (BP Location: Left Arm)   Temp 97.6 F (36.4 C) (Oral)   Ht 6' 1.5" (1.867 m)   Wt 214 lb (97.1 kg)   BMI 27.85 kg/m       Objective:   Physical Exam  Constitutional: He is oriented to person, place, and time. He appears well-developed and well-nourished. No distress.  HENT:  Head: Normocephalic and atraumatic.  Right Ear: External ear normal.  Left Ear: External ear normal.  Nose: Nose normal.  Mouth/Throat: Oropharynx is clear and moist. No oropharyngeal exudate.  Eyes: Pupils are equal, round, and reactive to light. Conjunctivae and EOM are normal. Right eye exhibits no discharge. Left eye exhibits no discharge. No scleral icterus.  Neck: Normal range of motion. Neck supple. No JVD present. No tracheal deviation present. No thyromegaly present.  Cardiovascular: Normal rate, regular rhythm, normal heart sounds and intact distal pulses. Exam reveals no gallop and no friction rub.  No murmur heard. Pulmonary/Chest: Effort normal and breath sounds normal. No stridor. No respiratory distress. He has no wheezes. He has no rales. He exhibits no tenderness.  Abdominal: Soft. Normal appearance, normal aorta and bowel sounds are normal. He exhibits no distension and no mass. There is no hepatosplenomegaly, splenomegaly or hepatomegaly. There is tenderness in the epigastric area. There is no rebound, no  guarding and no CVA tenderness. A hernia is present. Hernia confirmed positive in the ventral area.  Genitourinary:  Genitourinary Comments: Will do PSA   Musculoskeletal: Normal range of motion. He exhibits no edema, tenderness or deformity.  Lymphadenopathy:    He has no cervical adenopathy.  Neurological: He is alert and oriented to person, place, and time. He has normal reflexes. He displays normal reflexes. No cranial nerve deficit. He exhibits normal muscle tone. Coordination normal.  Skin: Skin is warm and dry. No rash noted. He is not diaphoretic. No erythema. No pallor.  Psychiatric: He has a normal mood and  affect. His behavior is normal. Judgment and thought content normal.  Nursing note and vitals reviewed.     Assessment & Plan:  1. Routine general medical examination at a health care facility - Encouraged to exercise and eat a heart healthy diet.  - Follow up in one year for next CPE  - Follow up sooner if needed - Basic metabolic panel - CBC with Differential/Platelet - Hemoglobin A1c - Hepatic function panel - Lipid panel - TSH  2. Gastroesophageal reflux disease without esophagitis - omeprazole (PRILOSEC) 20 MG capsule; Take 1 capsule (20 mg total) by mouth daily.  Dispense: 90 capsule; Refill: 3 - Follow up in one month or sooner if needed 3. Mixed hyperlipidemia - Consider statin  - Basic metabolic panel - CBC with Differential/Platelet - Hemoglobin A1c - Hepatic function panel - Lipid panel - TSH  4. Hypertension, essential - well controlled. No change in medications  - Basic metabolic panel - CBC with Differential/Platelet - Hemoglobin A1c - Hepatic function panel - Lipid panel - TSH - lisinopril (PRINIVIL,ZESTRIL) 10 MG tablet; Take 1 tablet (10 mg total) by mouth daily.  Dispense: 90 tablet; Refill: 3  5. Benign prostatic hyperplasia with incomplete bladder emptying  - PSA - tamsulosin (FLOMAX) 0.4 MG CAPS capsule; Take 1 capsule (0.4 mg total)  by mouth daily.  Dispense: 90 capsule; Refill: 3  6. Need for Tdap vaccination  - Tdap vaccine greater than or equal to 7yo IM  7. Need for hepatitis C screening test  - Hep C Antibody  8. Encounter for screening for HIV  - HIV antibody  [name], NP

## 2017-11-04 LAB — HEPATITIS C ANTIBODY
HEP C AB: NONREACTIVE
SIGNAL TO CUT-OFF: 0.01 (ref <1.00)

## 2017-11-04 LAB — HIV ANTIBODY (ROUTINE TESTING W REFLEX): HIV: NONREACTIVE

## 2017-11-06 ENCOUNTER — Encounter: Payer: 59 | Admitting: Adult Health

## 2017-12-03 ENCOUNTER — Other Ambulatory Visit: Payer: Self-pay | Admitting: Family Medicine

## 2017-12-03 DIAGNOSIS — R3914 Feeling of incomplete bladder emptying: Principal | ICD-10-CM

## 2017-12-03 DIAGNOSIS — N401 Enlarged prostate with lower urinary tract symptoms: Secondary | ICD-10-CM

## 2017-12-03 MED ORDER — TAMSULOSIN HCL 0.4 MG PO CAPS: 0.4000 mg | ORAL_CAPSULE | Freq: Every day | ORAL | 3 refills | Status: DC

## 2018-03-08 ENCOUNTER — Telehealth: Payer: Self-pay | Admitting: Family Medicine

## 2018-03-08 NOTE — Telephone Encounter (Signed)
Copied from [place] [id number]. Topic: Appointment Scheduling - Scheduling Inquiry for Clinic >> Mar 08, 2018  9:39 AM Vernona Rieger wrote: Reason for CRM: Patient states when he saw Tommi Rumps last time that when he was ready to call and Tommi Rumps would set him up a test for " erectile dysfunction " Please advise.

## 2018-03-09 ENCOUNTER — Other Ambulatory Visit: Payer: Self-pay | Admitting: Adult Health

## 2018-03-09 ENCOUNTER — Telehealth: Payer: Self-pay | Admitting: Family Medicine

## 2018-03-09 DIAGNOSIS — N529 Male erectile dysfunction, unspecified: Secondary | ICD-10-CM

## 2018-03-09 MED ORDER — HYDROCORTISONE ACE-PRAMOXINE 1-1 % RE CREA: 1.0000 "application " | TOPICAL_CREAM | Freq: Two times a day (BID) | RECTAL | 1 refills | Status: DC

## 2018-03-09 NOTE — Telephone Encounter (Signed)
I am pretty sure he is talking about checking his Testosterone. I have entered the lab, he can come in the morning and have it done

## 2018-03-09 NOTE — Telephone Encounter (Signed)
Cory, I do not see that you have prescribed this medication.  Please advise.

## 2018-03-09 NOTE — Telephone Encounter (Signed)
Copied from [place] [id number]. Topic: General - Other >> Mar 09, 2018  9:28 AM Carolyn Stare wrote:  Pt said he does want the generic  pt said it should be ACE TATE 1%   PRAMOXINE-HCL 1%   hydrocortisone (PROCTOCORT) 1 % CREA   [facility]  [place]

## 2018-03-10 NOTE — Telephone Encounter (Signed)
Pt scheduled to come in on 03/11/18 @ 8 AM.

## 2018-03-11 ENCOUNTER — Other Ambulatory Visit (INDEPENDENT_AMBULATORY_CARE_PROVIDER_SITE_OTHER): Payer: 59

## 2018-03-11 DIAGNOSIS — N529 Male erectile dysfunction, unspecified: Secondary | ICD-10-CM

## 2018-03-12 LAB — TESTOSTERONE TOTAL,FREE,BIO, MALES
Albumin: 4.1 g/dL (ref 3.6–5.1)
Sex Hormone Binding: 34 nmol/L (ref 22–77)
TESTOSTERONE: 459 ng/dL (ref 250–827)
Testosterone, Bioavailable: 115.4 ng/dL (ref >=110.0)
Testosterone, Free: 61.3 pg/mL (ref 46.0–224.0)

## 2018-03-17 ENCOUNTER — Telehealth: Payer: Self-pay | Admitting: Adult Health

## 2018-03-17 ENCOUNTER — Other Ambulatory Visit: Payer: Self-pay | Admitting: Adult Health

## 2018-03-17 MED ORDER — SILDENAFIL CITRATE 25 MG PO TABS: 25.0000 mg | ORAL_TABLET | Freq: Every day | ORAL | 1 refills | Status: DC | PRN

## 2018-03-17 NOTE — Telephone Encounter (Signed)
Spoke to patient, his Testosterone levels are normal. He reports issues with maintaining an erection. He would like to try cialis. He was given the estimated price on Cialis and decided against that.  He is ok with trying generic viagra. Will start off on low dose due to normal BP   Educated on side effects of medication

## 2018-03-17 NOTE — Telephone Encounter (Signed)
The levitra is not covered by the patients insurance and they are requesting an alternative for this medication.  Cory please advise. Thank you.

## 2018-03-17 NOTE — Telephone Encounter (Signed)
Pt given results per [name], "Testosterone levels are normal."; he verbalizes understanding; the pt would also like to speak with the provider about a prescription for cialis; he can be contacted at [phone], and his pharmacy is [place], [place]; will route to office for provider review; also see result note dated 03/17/18.

## 2018-03-18 ENCOUNTER — Other Ambulatory Visit: Payer: Self-pay | Admitting: Adult Health

## 2018-03-18 MED ORDER — SILDENAFIL CITRATE 25 MG PO TABS: 25.0000 mg | ORAL_TABLET | Freq: Every day | ORAL | 3 refills | Status: DC | PRN

## 2018-03-18 NOTE — Telephone Encounter (Signed)
BP medications are not covered by insurance unless the pt has BPH.

## 2018-03-18 NOTE — Telephone Encounter (Signed)
Left a message for a return call.  Rx placed at the front desk.

## 2018-03-18 NOTE — Telephone Encounter (Signed)
I will print out a prescription for him. He can pharmacy shop. [facility] is usually the cheapest and you do not need a membership to use the pharmacy.

## 2018-03-18 NOTE — Telephone Encounter (Signed)
Can we have the patient find out what exactly is covered by his insurance

## 2018-03-18 NOTE — Telephone Encounter (Signed)
Pt notified to pick up at the front desk. 

## 2019-01-12 ENCOUNTER — Other Ambulatory Visit: Payer: Self-pay

## 2019-01-12 ENCOUNTER — Ambulatory Visit (INDEPENDENT_AMBULATORY_CARE_PROVIDER_SITE_OTHER): Payer: 59 | Admitting: Adult Health

## 2019-01-12 ENCOUNTER — Encounter: Payer: Self-pay | Admitting: Adult Health

## 2019-01-12 DIAGNOSIS — S161XXA Strain of muscle, fascia and tendon at neck level, initial encounter: Secondary | ICD-10-CM

## 2019-01-12 MED ORDER — METHYLPREDNISOLONE 4 MG PO TBPK: ORAL_TABLET | ORAL | 0 refills | Status: DC

## 2019-01-12 MED ORDER — CYCLOBENZAPRINE HCL 10 MG PO TABS: 10.0000 mg | ORAL_TABLET | Freq: Every day | ORAL | 0 refills | Status: DC

## 2019-01-12 NOTE — Progress Notes (Signed)
Virtual Visit via Video Note  I connected with Carlos Walker on 01/12/19 at  3:30 PM EDT by a video enabled telemedicine application and verified that I am speaking with the correct person using two identifiers.  Location patient: home Location provider:work or home office Persons participating in the virtual visit: patient, provider  I discussed the limitations of evaluation and management by telemedicine and the availability of in person appointments. The patient expressed understanding and agreed to proceed.   HPI: 58 year old male who is being evaluated today for an acute issue of right sided shoulder and neck pain.  Ports pain is been pretty constant for the last 2 to 3 months.  It is described as "aching".  Discomfort starts at the top of his right shoulder and radiates up his neck to the back of the skull.  Pain is worse with palpation as well as range of motion to the right side.  He has been taking Tylenol and Motrin this helped at the beginning but does not seem to be working as well any longer.  He feels best after a warm shower.  Denies dizziness, lightheadedness,blurred vision, or loss of ROM.  Has not had any trauma.   ROS: See pertinent positives and negatives per HPI.  Past Medical History:  Diagnosis Date  . Arthritis   . Hyperlipidemia   . Hypertension   . Pain in lower back    epidural for pain orthopedics    Past Surgical History:  Procedure Laterality Date  . CIRCUMCISION    . right side repair     as child from football injury  . TONSILLECTOMY    . WISDOM TOOTH EXTRACTION      Family History  Problem Relation Age of Onset  . Diabetes Mother   . Heart disease Mother   . Hyperlipidemia Mother   . Stroke Mother   . Heart disease Father   . Heart attack Father   . Heart disease Brother   . Renal Disease Brother   . Hyperlipidemia Sister       Current Outpatient Medications:  .  cyclobenzaprine (FLEXERIL) 10 MG tablet, Take 1 tablet (10 mg total) by  mouth at bedtime for 15 days., Disp: 30 tablet, Rfl: 0 .  lisinopril (PRINIVIL,ZESTRIL) 10 MG tablet, Take 1 tablet (10 mg total) by mouth daily., Disp: 90 tablet, Rfl: 3 .  methylPREDNISolone (MEDROL DOSEPAK) 4 MG TBPK tablet, Take as directed, Disp: 21 tablet, Rfl: 0 .  omeprazole (PRILOSEC) 20 MG capsule, Take 1 capsule (20 mg total) by mouth daily., Disp: 90 capsule, Rfl: 3 .  pramoxine-hydrocortisone (ANALPRAM-HC) 1-1 % rectal cream, Place 1 application rectally 2 (two) times daily., Disp: 30 g, Rfl: 1 .  sildenafil (VIAGRA) 25 MG tablet, Take 1-2 tablets (25-50 mg total) by mouth daily as needed for erectile dysfunction., Disp: 30 tablet, Rfl: 3 .  tamsulosin (FLOMAX) 0.4 MG CAPS capsule, Take 1 capsule (0.4 mg total) by mouth daily., Disp: 90 capsule, Rfl: 3  Current Facility-Administered Medications:  .  0.9 %  sodium chloride infusion, 500 mL, Intravenous, Continuous, Carlean Purl, Ofilia Neas, MD  EXAM:  VITALS per patient if applicable:  GENERAL: alert, oriented, appears well and in no acute distress  HEENT: atraumatic, conjunttiva clear, no obvious abnormalities on inspection of external nose and ears  NECK: normal movements of the head and neck  LUNGS: on inspection no signs of respiratory distress, breathing rate appears normal, no obvious gross SOB, gasping or wheezing  CV: no obvious  cyanosis  MS: moves all visible extremities without noticeable abnormality  PSYCH/NEURO: pleasant and cooperative, no obvious depression or anxiety, speech and thought processing grossly intact  ASSESSMENT AND PLAN:  Discussed the following assessment and plan:  Exam consistent with muscle strain.  Possibly due to pillow or the way he is sleeping.  Will prescribe Flexeril and Medrol Dosepak.  He is going to come in for his physical soon if his neck pain continues and we can do further imaging evaluation.  Strain of neck muscle, initial encounter - Plan: cyclobenzaprine (FLEXERIL) 10 MG tablet,  methylPREDNISolone (MEDROL DOSEPAK) 4 MG TBPK tablet   I discussed the assessment and treatment plan with the patient. The patient was provided an opportunity to ask questions and all were answered. The patient agreed with the plan and demonstrated an understanding of the instructions.   The patient was advised to call back or seek an in-person evaluation if the symptoms worsen or if the condition fails to improve as anticipated.   [name], NP

## 2019-01-26 ENCOUNTER — Other Ambulatory Visit: Payer: Self-pay | Admitting: Adult Health

## 2019-01-26 DIAGNOSIS — I10 Essential (primary) hypertension: Secondary | ICD-10-CM

## 2019-01-27 NOTE — Telephone Encounter (Signed)
Pt due for cpx.  Left a message for a return call.  CRM created.

## 2019-01-28 NOTE — Telephone Encounter (Signed)
Sent to the pharmacy by e-scribe.  Pt now has upcoming cpx.

## 2019-02-01 ENCOUNTER — Ambulatory Visit: Payer: 59 | Admitting: Adult Health

## 2019-02-02 ENCOUNTER — Other Ambulatory Visit: Payer: Self-pay

## 2019-02-02 ENCOUNTER — Ambulatory Visit (INDEPENDENT_AMBULATORY_CARE_PROVIDER_SITE_OTHER): Payer: 59

## 2019-02-02 ENCOUNTER — Encounter: Payer: Self-pay | Admitting: Adult Health

## 2019-02-02 ENCOUNTER — Ambulatory Visit (INDEPENDENT_AMBULATORY_CARE_PROVIDER_SITE_OTHER): Payer: 59 | Admitting: Adult Health

## 2019-02-02 VITALS — BP 132/80 | Temp 97.5°F | Wt 212.0 lb

## 2019-02-02 DIAGNOSIS — M542 Cervicalgia: Secondary | ICD-10-CM

## 2019-02-02 MED ORDER — METHYLPREDNISOLONE ACETATE 40 MG/ML IJ SUSP
40.0000 mg | Freq: Once | INTRAMUSCULAR | Status: AC
  Administered 2019-02-02: 40 mg via INTRAMUSCULAR

## 2019-02-02 NOTE — Progress Notes (Signed)
Subjective:    Patient ID: Carlos Walker, male    DOB: Jan 25, 1961, 58 y.o.   MRN: [id number]  HPI 57 year old male who  has a past medical history of Arthritis, Hyperlipidemia, Hypertension, and Pain in lower back.  He presents to the office today for follow-up regarding an acute issue of right sided shoulder and neck pain.  He was last seen for this issue on 01/12/2019 at this time he had had consistent pain for the last 2 to 3 months.  Prescribed a Medrol Dosepak and muscle relaxers.  He reports that he has not had any significant improvement symptoms.  He continues to complain of a "aching" discomfort that starts at the top of his right shoulder and radiates up to the base of skull.  Pain is worse with vertical and horizontal head movements and he also reports "sometimes I feel a clicking".  Does feel best after a warm shower.  He has not noticed any dizziness, lightheadedness, or blurred vision.  Review of Systems See HPI   Past Medical History:  Diagnosis Date  . Arthritis   . Hyperlipidemia   . Hypertension   . Pain in lower back    epidural for pain orthopedics    Social History   Socioeconomic History  . Marital status: Single    Spouse name: Not on file  . Number of children: Not on file  . Years of education: Not on file  . Highest education level: Not on file  Occupational History  . Not on file  Social Needs  . Financial resource strain: Not on file  . Food insecurity    Worry: Not on file    Inability: Not on file  . Transportation needs    Medical: Not on file    Non-medical: Not on file  Tobacco Use  . Smoking status: Former Smoker    Packs/day: 0.25    Years: 20.00    Pack years: 5.00    Types: Cigarettes    Quit date: 09/20/2016    Years since quitting: 2.3  . Smokeless tobacco: Never Used  . Tobacco comment: less than 1/2 pack per day  Substance and Sexual Activity  . Alcohol use: No  . Drug use: No  . Sexual activity: Not on file  Lifestyle  .  Physical activity    Days per week: Not on file    Minutes per session: Not on file  . Stress: Not on file  Relationships  . Social Herbalist on phone: Not on file    Gets together: Not on file    Attends religious service: Not on file    Active member of club or organization: Not on file    Attends meetings of clubs or organizations: Not on file    Relationship status: Not on file  . Intimate partner violence    Fear of current or ex partner: Not on file    Emotionally abused: Not on file    Physically abused: Not on file    Forced sexual activity: Not on file  Other Topics Concern  . Not on file  Social History Narrative  . Not on file    Past Surgical History:  Procedure Laterality Date  . CIRCUMCISION    . right side repair     as child from football injury  . TONSILLECTOMY    . WISDOM TOOTH EXTRACTION      Family History  Problem Relation Age of  Onset  . Diabetes Mother   . Heart disease Mother   . Hyperlipidemia Mother   . Stroke Mother   . Heart disease Father   . Heart attack Father   . Heart disease Brother   . Renal Disease Brother   . Hyperlipidemia Sister     No Known Allergies  Current Outpatient Medications on File Prior to Visit  Medication Sig Dispense Refill  . lisinopril (ZESTRIL) 10 MG tablet TAKE 1 TABLET BY MOUTH EVERY DAY 90 tablet 0  . omeprazole (PRILOSEC) 20 MG capsule Take 1 capsule (20 mg total) by mouth daily. 90 capsule 3  . pramoxine-hydrocortisone (ANALPRAM-HC) 1-1 % rectal cream Place 1 application rectally 2 (two) times daily. 30 g 1  . sildenafil (VIAGRA) 25 MG tablet Take 1-2 tablets (25-50 mg total) by mouth daily as needed for erectile dysfunction. 30 tablet 3  . tamsulosin (FLOMAX) 0.4 MG CAPS capsule Take 1 capsule (0.4 mg total) by mouth daily. 90 capsule 3   Current Facility-Administered Medications on File Prior to Visit  Medication Dose Route Frequency Provider Last Rate Last Dose  . 0.9 %  sodium chloride  infusion  500 mL Intravenous Continuous Gatha Mayer, MD        BP 132/80   Temp (!) 97.5 F (36.4 C)   Wt 212 lb (96.2 kg)   BMI 27.59 kg/m       Objective:   Physical Exam Vitals signs and nursing note reviewed.  Constitutional:      Appearance: Normal appearance.  Neck:     Musculoskeletal: Normal range of motion and neck supple. Muscular tenderness present. No neck rigidity.     Vascular: No carotid bruit.  Musculoskeletal:        General: Tenderness (right trapezius ) present.  Lymphadenopathy:     Cervical: No cervical adenopathy.  Skin:    Capillary Refill: Capillary refill takes less than 2 seconds.  Neurological:     General: No focal deficit present.     Mental Status: He is alert and oriented to person, place, and time.  Psychiatric:        Mood and Affect: Mood normal.        Behavior: Behavior normal.        Thought Content: Thought content normal.        Judgment: Judgment normal.       Assessment & Plan:  1. Neck pain A trigger point injection was performed at the site of maximal tenderness using 1% plain Lidocaine and 40 mg Depo Medrol. This was well tolerated, and followed by minor relief of pain. He was advised to allow up to 72 hours for maximum relief.   Will get xray of cervical spine and follow up with tomorrow.   - DG Cervical Spine Complete; Future - methylPREDNISolone acetate (DEPO-MEDROL) injection 40 mg  [name], NP

## 2019-02-09 ENCOUNTER — Telehealth: Payer: Self-pay | Admitting: Adult Health

## 2019-02-09 DIAGNOSIS — M542 Cervicalgia: Secondary | ICD-10-CM

## 2019-02-09 NOTE — Telephone Encounter (Signed)
The patient called today wanting to know why Carlos Walker did not call him back on Friday. He is still having pain in his neck. He would like for [facility] to call him back today after 3 PM.  Please Advise

## 2019-02-09 NOTE — Telephone Encounter (Signed)
The patient and he continues to have right-sided neck and shoulder pain.  This is despite trigger point injection as well as Medrol Dosepak and muscle relaxers.  Will refer to physical therapy for TENS and dry needling if indicated.

## 2019-02-22 ENCOUNTER — Encounter: Payer: Self-pay | Admitting: Physical Therapy

## 2019-02-22 ENCOUNTER — Ambulatory Visit (INDEPENDENT_AMBULATORY_CARE_PROVIDER_SITE_OTHER): Payer: 59 | Admitting: Physical Therapy

## 2019-02-22 ENCOUNTER — Other Ambulatory Visit: Payer: Self-pay

## 2019-02-22 DIAGNOSIS — M62830 Muscle spasm of back: Secondary | ICD-10-CM | POA: Diagnosis not present

## 2019-02-22 DIAGNOSIS — M542 Cervicalgia: Secondary | ICD-10-CM | POA: Diagnosis not present

## 2019-02-23 ENCOUNTER — Encounter: Payer: Self-pay | Admitting: Physical Therapy

## 2019-02-23 NOTE — Patient Instructions (Signed)
UT stetch 30 sec x3;  scap squeeze x20 Shoulder rolls x20

## 2019-02-23 NOTE — Therapy (Signed)
[facility] [address] [place], [place], [address] Phone: [phone]   Fax:  [phone]  Physical Therapy Evaluation  Patient Details  Name: Carlos Walker MRN: [id number] Date of Birth: [birth year] Referring Provider (PT): [name]   Encounter Date: 02/22/2019  PT End of Session - 02/23/19 1143    Visit Number  1    Number of Visits  12    Date for PT Re-Evaluation  04/06/19    Authorization Type  [employer]    PT Start Time  1415    PT Stop Time  1500    PT Time Calculation (min)  45 min    Activity Tolerance  Patient tolerated treatment well    Behavior During Therapy  [facility] for tasks assessed/performed       Past Medical History:  Diagnosis Date  . Arthritis   . Hyperlipidemia   . Hypertension   . Pain in lower back    epidural for pain orthopedics    Past Surgical History:  Procedure Laterality Date  . CIRCUMCISION    . right side repair     as child from football injury  . TONSILLECTOMY    . WISDOM TOOTH EXTRACTION      There were no vitals filed for this visit.   Subjective Assessment - 02/22/19 1418    Subjective  Pt states increased pain for about 3-4 months in R shoulder and neck. He is R handed. He works full time at [employer], standing, running machines. He had previous pain years ago that resolved with therapy.    Limitations  House hold activities;Lifting    Patient Stated Goals  decreased pain    Currently in Pain?  Yes    Pain Score  5     Pain Location  Neck    Pain Orientation  Right    Pain Descriptors / Indicators  Aching    Pain Type  Acute pain    Pain Onset  1 to 4 weeks ago    Pain Frequency  Intermittent    Aggravating Factors   rotation R>L. flexion, work duties.         [facility] PT Assessment - 02/23/19 0001      Assessment   Medical Diagnosis  Neck Pain    Referring Provider (PT)  Cory Nafziger    Hand Dominance  Right    Prior Therapy  years ago for neck       Balance Screen   Has the patient fallen in the past 6 months  No      Prior Function   Level of Independence  Independent      Cognition   Overall Cognitive Status  Within Functional Limits for tasks assessed      Posture/Postural Control   Posture Comments  Flexible, poor posture, seated, forward head and rounded shoulders.       ROM / Strength   AROM / PROM / Strength  AROM;Strength      AROM   Overall AROM Comments  Bil Shoulders: WNL    AROM Assessment Site  Cervical    Cervical Flexion  WFL    Cervical Extension  mild limitation    Cervical - Right Side Bend  mild limitation    Cervical - Left Side Bend  mild limitation    Cervical - Right Rotation  mod limitation/pain    Cervical - Left Rotation  mod limitation      Strength   Overall Strength  Comments  UE: 4+/5      Palpation   Palpation comment  Pain, tightness and hypertonicity of R>L UTs, paraspinals,       Special Tests   Other special tests  Ned ULTT, All shoulder tests negative                Objective measurements completed on examination: See above findings.      [facility] Adult PT Treatment/Exercise - 02/23/19 0001      Exercises   Exercises  Neck      Neck Exercises: Seated   Shoulder Rolls  15 reps    Other Seated Exercise  Scap retract x15;       Manual Therapy   Manual therapy comments  skilled palpation and monitoring of soft tissue with dry needling.       Neck Exercises: Stretches   Upper Trapezius Stretch  3 reps;30 seconds;Left;Right       Trigger Point Dry Needling - 02/23/19 0001    Consent Given?  Yes    Education Handout Provided  Yes    Muscles Treated Head and Neck  Upper trapezius    Upper Trapezius Response  Twitch reponse elicited;Palpable increased muscle length   Bil          PT Education - 02/23/19 1142    Education Details  PT POC, exam findings, initial HEP    Person(s) Educated  Patient    Methods  Explanation;Demonstration;Tactile cues;Verbal  cues;Handout    Comprehension  Verbalized understanding;Tactile cues required;Need further instruction;Returned demonstration;Verbal cues required       PT Short Term Goals - 02/23/19 1147      PT SHORT TERM GOAL #1   Title  Pt to be independent with initial HEP    Time  2    Period  Weeks    Status  New    Target Date  03/08/19      PT SHORT TERM GOAL #2   Title  Pt to report decreased pain in R side of neck, to 3/10    Time  2    Period  Weeks    Status  New    Target Date  03/08/19        PT Long Term Goals - 02/23/19 1148      PT LONG TERM GOAL #1   Title  Pt to be independent with long term HEP    Time  6    Period  Weeks    Status  New    Target Date  04/05/19      PT LONG TERM GOAL #2   Title  Pt to report decreased pain in neck pain to 0-2/10 with head turns and work duties    Time  6    Period  Weeks    Status  New    Target Date  04/05/19      PT LONG TERM GOAL #3   Title  Pt to demo improved soft tissue restrictions to be [facility] for R neck and shoulder    Time  6    Period  Weeks    Status  New    Target Date  04/05/19      PT LONG TERM GOAL #4   Title  Pt to demo improved cervical rotation ROM, to be WNL for pt age and dx.    Time  6    Period  Weeks    Status  New    Target Date  04/05/19             Plan - 02/23/19 1151    Clinical Impression Statement  Pt presents with primary complaint of increased pain in cervical region R>L and into R shoulder. Pt with decreased cervical ROM,mostly for rotation and SB. He has increased muscle spasm, and soreness in R>L upper traps and cervical musculature. He has full ROM of shoulder, and negative special tests for other shoulder pathology. Pt with poor seated and standing posture, and will benefit from education on optimal posture for work duties. Pt to benefit from skilled PT to improve deficits and pain. Pt with good tolerance and response from dry needling today.    Personal Factors and Comorbidities   Profession    Examination-Activity Limitations  Reach Overhead;Carry;Lift    Examination-Participation Restrictions  Cleaning;Community Activity;Driving    Stability/Clinical Decision Making  Stable/Uncomplicated    Clinical Decision Making  Low    Rehab Potential  Good    PT Frequency  2x / week    PT Duration  6 weeks    PT Treatment/Interventions  ADLs/Self Care Home Management;Cryotherapy;Electrical Stimulation;DME Instruction;Ultrasound;Traction;Moist Heat;Iontophoresis 4mg /ml Dexamethasone;Functional mobility training;Therapeutic activities;Therapeutic exercise;Patient/family education;Neuromuscular re-education;Manual techniques;Scar mobilization;Passive range of motion;Dry needling;Spinal Manipulations;Joint Manipulations    Consulted and Agree with Plan of Care  Patient       Patient will benefit from skilled therapeutic intervention in order to improve the following deficits and impairments:  Decreased range of motion, Impaired UE functional use, Increased muscle spasms, Decreased activity tolerance, Pain, Impaired flexibility, Improper body mechanics, Decreased mobility, Decreased strength  Visit Diagnosis: 1. Cervicalgia   2. Muscle spasm of back        Problem List Patient Active Problem List   Diagnosis Date Noted  . Hyperlipidemia 09/18/2016  . Constipation, chronic 09/18/2016  . BPH (benign prostatic hyperplasia) 09/18/2016  . Hypertension, essential 10/03/2009    Lyndee Hensen, PT, DPT 1:03 PM  02/23/19    [facility] [place], [place], [address] Phone: [phone]   Fax:  [phone]  Name: Carlos Walker MRN: [id number] Date of Birth: 12-Dec-1960

## 2019-02-24 ENCOUNTER — Other Ambulatory Visit: Payer: Self-pay

## 2019-02-24 ENCOUNTER — Encounter: Payer: Self-pay | Admitting: Physical Therapy

## 2019-02-24 ENCOUNTER — Ambulatory Visit (INDEPENDENT_AMBULATORY_CARE_PROVIDER_SITE_OTHER): Payer: 59 | Admitting: Physical Therapy

## 2019-02-24 DIAGNOSIS — M542 Cervicalgia: Secondary | ICD-10-CM | POA: Diagnosis not present

## 2019-02-24 DIAGNOSIS — M62830 Muscle spasm of back: Secondary | ICD-10-CM

## 2019-02-24 NOTE — Therapy (Signed)
[facility] [address] [place], [place], [address] Phone: [phone]   Fax:  [phone]  Physical Therapy Treatment  Patient Details  Name: Carlos Walker MRN: [id number] Date of Birth: 24-Mar-1961 Referring Provider (PT): [name]   Encounter Date: 02/24/2019  PT End of Session - 02/24/19 1509    Visit Number  2    Number of Visits  12    Date for PT Re-Evaluation  04/06/19    Authorization Type  Aetna    PT Start Time  9735    PT Stop Time  1556    PT Time Calculation (min)  50 min    Activity Tolerance  Patient tolerated treatment well    Behavior During Therapy  [facility] for tasks assessed/performed       Past Medical History:  Diagnosis Date  . Arthritis   . Hyperlipidemia   . Hypertension   . Pain in lower back    epidural for pain orthopedics    Past Surgical History:  Procedure Laterality Date  . CIRCUMCISION    . right side repair     as child from football injury  . TONSILLECTOMY    . WISDOM TOOTH EXTRACTION      There were no vitals filed for this visit.  Subjective Assessment - 02/24/19 1509    Subjective  Pt was sore from dry needling on Tuesday, but was better yesterday. He is sore today.    Pain Score  5     Pain Location  Neck    Pain Orientation  Right    Pain Descriptors / Indicators  Aching    Pain Onset  1 to 4 weeks ago    Pain Frequency  Intermittent                       [facility] Adult PT Treatment/Exercise - 02/24/19 1510      Posture/Postural Control   Posture Comments  --      Exercises   Exercises  Neck      Neck Exercises: Theraband   Rows  20 reps;Red    Horizontal ABduction  20 reps;Red      Neck Exercises: Seated   Neck Retraction  20 reps    Shoulder Rolls  20 reps    Other Seated Exercise  --      Neck Exercises: Supine   Cervical Rotation  10 reps      Modalities   Modalities  Moist Heat      Moist Heat Therapy   Number Minutes Moist Heat  10  Minutes    Moist Heat Location  Cervical      Manual Therapy   Manual Therapy  Soft tissue mobilization;Joint mobilization;Manual Traction;Passive ROM    Manual therapy comments  skilled palpation and monitoring of soft tissue with dry needling.     Joint Mobilization  PA cervical mobs, side glides    Soft tissue mobilization  DTM R UT/levator    Passive ROM  Manual stretching for bil UTs       Neck Exercises: Stretches   Upper Trapezius Stretch  3 reps;30 seconds;Left;Right    Corner Stretch  3 reps;30 seconds    Corner Stretch Limitations  Doorway/90 deg       Trigger Point Dry Needling - 02/24/19 0001    Consent Given?  Yes    Education Handout Provided  Previously provided    Muscles Treated Head and Neck  Upper trapezius;Levator scapulae    Upper Trapezius Response  Twitch reponse elicited;Palpable increased muscle length   R   Levator Scapulae Response  Twitch response elicited;Palpable increased muscle length   R            PT Short Term Goals - 02/23/19 1147      PT SHORT TERM GOAL #1   Title  Pt to be independent with initial HEP    Time  2    Period  Weeks    Status  New    Target Date  03/08/19      PT SHORT TERM GOAL #2   Title  Pt to report decreased pain in R side of neck, to 3/10    Time  2    Period  Weeks    Status  New    Target Date  03/08/19        PT Long Term Goals - 02/23/19 1148      PT LONG TERM GOAL #1   Title  Pt to be independent with long term HEP    Time  6    Period  Weeks    Status  New    Target Date  04/05/19      PT LONG TERM GOAL #2   Title  Pt to report decreased pain in neck pain to 0-2/10 with head turns and work duties    Time  6    Period  Weeks    Status  New    Target Date  04/05/19      PT LONG TERM GOAL #3   Title  Pt to demo improved soft tissue restrictions to be [facility] for R neck and shoulder    Time  6    Period  Weeks    Status  New    Target Date  04/05/19      PT LONG TERM GOAL #4   Title  Pt  to demo improved cervical rotation ROM, to be WNL for pt age and dx.    Time  6    Period  Weeks    Status  New    Target Date  04/05/19            Plan - 02/24/19 1554    Clinical Impression Statement  Pt with trigger points and pain in R UT and levator today, addressed with DN and DTM. Progressed postural strengthening. Education done for optimal UE mechanics with work duties. Pt with improved ROm after session today. Plan to progress as tolerated.    Personal Factors and Comorbidities  Profession    Examination-Activity Limitations  Reach Overhead;Carry;Lift    Examination-Participation Restrictions  Cleaning;Community Activity;Driving    Stability/Clinical Decision Making  Stable/Uncomplicated    Rehab Potential  Good    PT Frequency  2x / week    PT Duration  6 weeks    PT Treatment/Interventions  ADLs/Self Care Home Management;Cryotherapy;Electrical Stimulation;DME Instruction;Ultrasound;Traction;Moist Heat;Iontophoresis 4mg /ml Dexamethasone;Functional mobility training;Therapeutic activities;Therapeutic exercise;Patient/family education;Neuromuscular re-education;Manual techniques;Scar mobilization;Passive range of motion;Dry needling;Spinal Manipulations;Joint Manipulations    Consulted and Agree with Plan of Care  Patient       Patient will benefit from skilled therapeutic intervention in order to improve the following deficits and impairments:  Decreased range of motion, Impaired UE functional use, Increased muscle spasms, Decreased activity tolerance, Pain, Impaired flexibility, Improper body mechanics, Decreased mobility, Decreased strength  Visit Diagnosis: 1. Cervicalgia   2. Muscle spasm of back  Problem List Patient Active Problem List   Diagnosis Date Noted  . Hyperlipidemia 09/18/2016  . Constipation, chronic 09/18/2016  . BPH (benign prostatic hyperplasia) 09/18/2016  . Hypertension, essential 10/03/2009    Lyndee Hensen, PT, DPT 4:01 PM   02/24/19    [facility] [place], [place], [address] Phone: [phone]   Fax:  [phone]  Name: KALIEB FREELAND MRN: [id number] Date of Birth: [birth year]

## 2019-03-02 ENCOUNTER — Other Ambulatory Visit: Payer: Self-pay

## 2019-03-02 ENCOUNTER — Ambulatory Visit (INDEPENDENT_AMBULATORY_CARE_PROVIDER_SITE_OTHER): Payer: 59 | Admitting: Physical Therapy

## 2019-03-02 ENCOUNTER — Encounter: Payer: Self-pay | Admitting: Physical Therapy

## 2019-03-02 DIAGNOSIS — M542 Cervicalgia: Secondary | ICD-10-CM

## 2019-03-02 DIAGNOSIS — M62830 Muscle spasm of back: Secondary | ICD-10-CM | POA: Diagnosis not present

## 2019-03-03 NOTE — Therapy (Signed)
[facility] [address] [place], [place], [address] Phone: [phone]   Fax:  [phone]  Physical Therapy Treatment  Patient Details  Name: Carlos Walker MRN: [id number] Date of Birth: 06/10/1961 Referring Provider (PT): [name]   Encounter Date: 03/02/2019  PT End of Session - 03/02/19 1507    Visit Number  3    Number of Visits  12    Date for PT Re-Evaluation  04/06/19    Authorization Type  Aetna    PT Start Time  1503    PT Stop Time  1546    PT Time Calculation (min)  43 min    Activity Tolerance  Patient tolerated treatment well    Behavior During Therapy  [facility] for tasks assessed/performed       Past Medical History:  Diagnosis Date  . Arthritis   . Hyperlipidemia   . Hypertension   . Pain in lower back    epidural for pain orthopedics    Past Surgical History:  Procedure Laterality Date  . CIRCUMCISION    . right side repair     as child from football injury  . TONSILLECTOMY    . WISDOM TOOTH EXTRACTION      There were no vitals filed for this visit.  Subjective Assessment - 03/02/19 1506    Subjective  Pt states soreness after last visit. Pain overall much better, more intermittent, but still sore with head turns.    Currently in Pain?  Yes    Pain Score  4     Pain Location  Neck    Pain Orientation  Right    Pain Descriptors / Indicators  Aching;Tightness    Pain Onset  1 to 4 weeks ago    Pain Frequency  Intermittent                       [facility] Adult PT Treatment/Exercise - 03/02/19 1507      Exercises   Exercises  Neck      Neck Exercises: Theraband   Rows  20 reps;Red    Shoulder External Rotation  20 reps;Red    Shoulder External Rotation Limitations  Bil    Horizontal ABduction  20 reps;Red      Neck Exercises: Standing   Neck Retraction  10 reps    Other Standing Exercises  UE scaption and abd x15 each, at wall for posture       Neck Exercises: Seated   Neck Retraction  10 reps    Shoulder Rolls  --      Neck Exercises: Supine   Cervical Rotation  5 reps    Cervical Rotation Limitations  with self overpressure to the R      Modalities   Modalities  --      Moist Heat Therapy   Moist Heat Location  --      Manual Therapy   Manual Therapy  Soft tissue mobilization;Joint mobilization;Manual Traction;Passive ROM    Manual therapy comments  skilled palpation and monitoring of soft tissue with dry needling.     Joint Mobilization  PA cervical mobs, side glides    Soft tissue mobilization  DTM/IASTM to Bil UT/levator    Passive ROM  Manual stretching for bil UTs       Neck Exercises: Stretches   Upper Trapezius Stretch  3 reps;30 seconds;Left;Right    Corner Stretch  3 reps;30 seconds    Corner Stretch Limitations  Doorway/90 deg    Other Neck Stretches  Supine pec stretch 30 sec x3;        Trigger Point Dry Needling - 03/03/19 0001    Consent Given?  Yes    Education Handout Provided  Previously provided    Muscles Treated Head and Neck  Upper trapezius;Levator scapulae    Upper Trapezius Response  Twitch reponse elicited;Palpable increased muscle length   R   Levator Scapulae Response  Twitch response elicited;Palpable increased muscle length   R            PT Short Term Goals - 02/23/19 1147      PT SHORT TERM GOAL #1   Title  Pt to be independent with initial HEP    Time  2    Period  Weeks    Status  New    Target Date  03/08/19      PT SHORT TERM GOAL #2   Title  Pt to report decreased pain in R side of neck, to 3/10    Time  2    Period  Weeks    Status  New    Target Date  03/08/19        PT Long Term Goals - 02/23/19 1148      PT LONG TERM GOAL #1   Title  Pt to be independent with long term HEP    Time  6    Period  Weeks    Status  New    Target Date  04/05/19      PT LONG TERM GOAL #2   Title  Pt to report decreased pain in neck pain to 0-2/10 with head turns and work duties    Time  6     Period  Weeks    Status  New    Target Date  04/05/19      PT LONG TERM GOAL #3   Title  Pt to demo improved soft tissue restrictions to be [facility] for R neck and shoulder    Time  6    Period  Weeks    Status  New    Target Date  04/05/19      PT LONG TERM GOAL #4   Title  Pt to demo improved cervical rotation ROM, to be WNL for pt age and dx.    Time  6    Period  Weeks    Status  New    Target Date  04/05/19            Plan - 03/03/19 0902    Clinical Impression Statement  Pt with improved tightness from last visit, but does have painful trigger points in R Levator and UT, addressed with DN, and IASTM today. Pt with good response, improved cervical ROM following session. Pt making good progress, plan to progress postural strength as able.    Personal Factors and Comorbidities  Profession    Examination-Activity Limitations  Reach Overhead;Carry;Lift    Examination-Participation Restrictions  Cleaning;Community Activity;Driving    Stability/Clinical Decision Making  Stable/Uncomplicated    Rehab Potential  Good    PT Frequency  2x / week    PT Duration  6 weeks    PT Treatment/Interventions  ADLs/Self Care Home Management;Cryotherapy;Electrical Stimulation;DME Instruction;Ultrasound;Traction;Moist Heat;Iontophoresis 4mg /ml Dexamethasone;Functional mobility training;Therapeutic activities;Therapeutic exercise;Patient/family education;Neuromuscular re-education;Manual techniques;Scar mobilization;Passive range of motion;Dry needling;Spinal Manipulations;Joint Manipulations    Consulted and Agree with Plan of Care  Patient       Patient will  benefit from skilled therapeutic intervention in order to improve the following deficits and impairments:  Decreased range of motion, Impaired UE functional use, Increased muscle spasms, Decreased activity tolerance, Pain, Impaired flexibility, Improper body mechanics, Decreased mobility, Decreased strength  Visit Diagnosis: 1. Cervicalgia    2. Muscle spasm of back        Problem List Patient Active Problem List   Diagnosis Date Noted  . Hyperlipidemia 09/18/2016  . Constipation, chronic 09/18/2016  . BPH (benign prostatic hyperplasia) 09/18/2016  . Hypertension, essential 10/03/2009    Lyndee Hensen, PT, DPT 9:07 AM  03/03/19    [facility] [place], [place], [address] Phone: [phone]   Fax:  [phone]  Name: Carlos Walker MRN: [id number] Date of Birth: [birth year]

## 2019-03-08 ENCOUNTER — Encounter: Payer: 59 | Admitting: Physical Therapy

## 2019-03-09 ENCOUNTER — Ambulatory Visit (INDEPENDENT_AMBULATORY_CARE_PROVIDER_SITE_OTHER): Payer: 59 | Admitting: Physical Therapy

## 2019-03-09 ENCOUNTER — Encounter: Payer: Self-pay | Admitting: Physical Therapy

## 2019-03-09 DIAGNOSIS — M542 Cervicalgia: Secondary | ICD-10-CM | POA: Diagnosis not present

## 2019-03-09 DIAGNOSIS — M62830 Muscle spasm of back: Secondary | ICD-10-CM

## 2019-03-09 NOTE — Therapy (Signed)
[facility] [address] [place], [place], [address] Phone: [phone]   Fax:  [phone]  Physical Therapy Treatment  Patient Details  Name: Carlos Walker MRN: [id number] Date of Birth: 08-Apr-1961 Referring Provider (PT): [name]   Encounter Date: 03/09/2019  PT End of Session - 03/09/19 1557    Visit Number  4    Number of Visits  12    Date for PT Re-Evaluation  04/06/19    Authorization Type  Aetna    PT Start Time  1510    PT Stop Time  1550    PT Time Calculation (min)  40 min    Activity Tolerance  Patient tolerated treatment well    Behavior During Therapy  [facility] for tasks assessed/performed       Past Medical History:  Diagnosis Date  . Arthritis   . Hyperlipidemia   . Hypertension   . Pain in lower back    epidural for pain orthopedics    Past Surgical History:  Procedure Laterality Date  . CIRCUMCISION    . right side repair     as child from football injury  . TONSILLECTOMY    . WISDOM TOOTH EXTRACTION      There were no vitals filed for this visit.  Subjective Assessment - 03/09/19 1556    Subjective  Pt states decreased pain overall, was sore from last DN session. He has mild soreness mostly with R rotation.    Patient Stated Goals  decreased pain    Currently in Pain?  Yes    Pain Score  4     Pain Location  Neck    Pain Orientation  Right    Pain Descriptors / Indicators  Aching;Tightness    Pain Type  Acute pain    Pain Onset  1 to 4 weeks ago    Pain Frequency  Intermittent                       [facility] Adult PT Treatment/Exercise - 03/09/19 1513      Exercises   Exercises  Neck      Neck Exercises: Theraband   Rows  20 reps;Fabre    Shoulder External Rotation  20 reps;Red    Shoulder External Rotation Limitations  Bil    Horizontal ABduction  20 reps;Red      Neck Exercises: Standing   Neck Retraction  --    Other Standing Exercises  UE scaption x10 with 2 lb        Neck Exercises: Seated   Neck Retraction  --    Cervical Rotation  5 reps    Cervical Rotation Limitations  with overpressure to the R      Neck Exercises: Supine   Neck Retraction  5 reps;5 secs    Cervical Rotation  --    Cervical Rotation Limitations  --      Manual Therapy   Manual Therapy  Soft tissue mobilization;Joint mobilization;Manual Traction;Passive ROM    Manual therapy comments  --    Joint Mobilization  --    Soft tissue mobilization  DTM and TPR to R cervica region and UT    Passive ROM  Manual stretching for bil UTs       Neck Exercises: Stretches   Upper Trapezius Stretch  3 reps;30 seconds;Left;Right    Corner Stretch  3 reps;30 seconds    Corner Stretch Limitations  Doorway/90 deg  Other Neck Stretches  --             PT Education - 03/09/19 1557    Education Details  Education on [place], use, precautions,    Person(s) Educated  Patient    Methods  Explanation    Comprehension  Verbalized understanding       PT Short Term Goals - 03/09/19 1558      PT SHORT TERM GOAL #1   Title  Pt to be independent with initial HEP    Time  2    Period  Weeks    Status  Achieved    Target Date  03/08/19      PT SHORT TERM GOAL #2   Title  Pt to report decreased pain in R side of neck, to 3/10    Time  2    Period  Weeks    Status  Partially Met    Target Date  03/08/19        PT Long Term Goals - 02/23/19 1148      PT LONG TERM GOAL #1   Title  Pt to be independent with long term HEP    Time  6    Period  Weeks    Status  New    Target Date  04/05/19      PT LONG TERM GOAL #2   Title  Pt to report decreased pain in neck pain to 0-2/10 with head turns and work duties    Time  6    Period  Weeks    Status  New    Target Date  04/05/19      PT LONG TERM GOAL #3   Title  Pt to demo improved soft tissue restrictions to be [facility] for R neck and shoulder    Time  6    Period  Weeks    Status  New    Target Date  04/05/19      PT LONG  TERM GOAL #4   Title  Pt to demo improved cervical rotation ROM, to be WNL for pt age and dx.    Time  6    Period  Weeks    Status  New    Target Date  04/05/19            Plan - 03/09/19 1558    Clinical Impression Statement  Pt with improving pain overall. He has improved rotation to the R, with minimal/no pain today. He has pain and trigger point at mid to proximal UT region, addressed with DTM today. Pt declines DN today, but may benefit from additional DN in future. Pt progressing well.    Personal Factors and Comorbidities  Profession    Examination-Activity Limitations  Reach Overhead;Carry;Lift    Examination-Participation Restrictions  Cleaning;Community Activity;Driving    Stability/Clinical Decision Making  Stable/Uncomplicated    Rehab Potential  Good    PT Frequency  2x / week    PT Duration  6 weeks    PT Treatment/Interventions  ADLs/Self Care Home Management;Cryotherapy;Electrical Stimulation;DME Instruction;Ultrasound;Traction;Moist Heat;Iontophoresis 7m/ml Dexamethasone;Functional mobility training;Therapeutic activities;Therapeutic exercise;Patient/family education;Neuromuscular re-education;Manual techniques;Scar mobilization;Passive range of motion;Dry needling;Spinal Manipulations;Joint Manipulations    Consulted and Agree with Plan of Care  Patient       Patient will benefit from skilled therapeutic intervention in order to improve the following deficits and impairments:  Decreased range of motion, Impaired UE functional use, Increased muscle spasms, Decreased activity tolerance, Pain, Impaired flexibility, Improper body mechanics, Decreased  mobility, Decreased strength  Visit Diagnosis: 1. Cervicalgia   2. Muscle spasm of back        Problem List Patient Active Problem List   Diagnosis Date Noted  . Hyperlipidemia 09/18/2016  . Constipation, chronic 09/18/2016  . BPH (benign prostatic hyperplasia) 09/18/2016  . Hypertension, essential 10/03/2009     Lyndee Hensen, PT, DPT 4:00 PM  03/09/19    [facility] [place], [place], [address] Phone: [phone]   Fax:  [phone]  Name: Carlos Walker MRN: [id number] Date of Birth: 1961/04/18

## 2019-03-10 ENCOUNTER — Encounter: Payer: 59 | Admitting: Physical Therapy

## 2019-03-16 ENCOUNTER — Ambulatory Visit (INDEPENDENT_AMBULATORY_CARE_PROVIDER_SITE_OTHER): Payer: 59 | Admitting: Physical Therapy

## 2019-03-16 ENCOUNTER — Other Ambulatory Visit: Payer: Self-pay

## 2019-03-16 DIAGNOSIS — M62830 Muscle spasm of back: Secondary | ICD-10-CM | POA: Diagnosis not present

## 2019-03-16 DIAGNOSIS — M542 Cervicalgia: Secondary | ICD-10-CM | POA: Diagnosis not present

## 2019-03-17 ENCOUNTER — Encounter: Payer: Self-pay | Admitting: Physical Therapy

## 2019-03-17 NOTE — Therapy (Signed)
[facility] [address] [place], [place], [address] Phone: [phone]   Fax:  [phone]  Physical Therapy Treatment  Patient Details  Name: Carlos Walker MRN: [id number] Date of Birth: [birth year] Referring Provider (PT): [name]   Encounter Date: 03/16/2019  PT End of Session - 03/17/19 0817    Visit Number  5    Number of Visits  12    Date for PT Re-Evaluation  04/06/19    Authorization Type  Aetna    PT Start Time  1517    PT Stop Time  1558    PT Time Calculation (min)  41 min    Activity Tolerance  Patient tolerated treatment well    Behavior During Therapy  [facility] for tasks assessed/performed       Past Medical History:  Diagnosis Date  . Arthritis   . Hyperlipidemia   . Hypertension   . Pain in lower back    epidural for pain orthopedics    Past Surgical History:  Procedure Laterality Date  . CIRCUMCISION    . right side repair     as child from football injury  . TONSILLECTOMY    . WISDOM TOOTH EXTRACTION      There were no vitals filed for this visit.  Subjective Assessment - 03/17/19 0814    Subjective  He states overall pain is better, but still feels soreness/tightness on R upper cervical region with R rotation, and returning from R rotation. Pt vauge about pinpointing pain location    Patient Stated Goals  decreased pain    Currently in Pain?  Yes    Pain Score  4     Pain Location  Neck    Pain Orientation  Right    Pain Descriptors / Indicators  Aching    Pain Type  Acute pain    Pain Onset  More than a month ago    Pain Frequency  Intermittent                       [facility] Adult PT Treatment/Exercise - 03/17/19 0001      Exercises   Exercises  Neck      Neck Exercises: Theraband   Rows  20 reps;Larch    Shoulder External Rotation  20 reps;Red    Shoulder External Rotation Limitations  Bil    Horizontal ABduction  20 reps;Red      Neck Exercises: Standing   Other  Standing Exercises  --      Neck Exercises: Seated   Neck Retraction  15 reps    Cervical Rotation  --    Cervical Rotation Limitations  --      Neck Exercises: Supine   Neck Retraction  5 reps;5 secs      Manual Therapy   Manual Therapy  Soft tissue mobilization;Joint mobilization;Manual Traction;Passive ROM    Manual therapy comments  skilled palpation and monitoring of soft tissue with dry needling.     Soft tissue mobilization  DTM to bil cervical region , sub occipitals, and paraspinals.    Passive ROM  Manual stretching for bil UTs     Manual Traction  10 sec x10      Neck Exercises: Stretches   Upper Trapezius Stretch  3 reps;30 seconds;Left;Right    Corner Stretch  3 reps;30 seconds    Corner Stretch Limitations  Doorway/45 and 90 deg       Trigger Point Dry Needling -  03/17/19 0001    Consent Given?  Yes    Education Handout Provided  Previously provided    Muscles Treated Head and Neck  Cervical multifidi;Splenius capitus    Splenius capitus Response  Palpable increased muscle length    Cervical multifidi Response  Palpable increased muscle length   R            PT Short Term Goals - 03/09/19 1558      PT SHORT TERM GOAL #1   Title  Pt to be independent with initial HEP    Time  2    Period  Weeks    Status  Achieved    Target Date  03/08/19      PT SHORT TERM GOAL #2   Title  Pt to report decreased pain in R side of neck, to 3/10    Time  2    Period  Weeks    Status  Partially Met    Target Date  03/08/19        PT Long Term Goals - 02/23/19 1148      PT LONG TERM GOAL #1   Title  Pt to be independent with long term HEP    Time  6    Period  Weeks    Status  New    Target Date  04/05/19      PT LONG TERM GOAL #2   Title  Pt to report decreased pain in neck pain to 0-2/10 with head turns and work duties    Time  6    Period  Weeks    Status  New    Target Date  04/05/19      PT LONG TERM GOAL #3   Title  Pt to demo improved soft  tissue restrictions to be [facility] for R neck and shoulder    Time  6    Period  Weeks    Status  New    Target Date  04/05/19      PT LONG TERM GOAL #4   Title  Pt to demo improved cervical rotation ROM, to be WNL for pt age and dx.    Time  6    Period  Weeks    Status  New    Target Date  04/05/19            Plan - 03/17/19 0819    Clinical Impression Statement  Pt with much tightness in cervical multifidus with dry needling today. He states relief of pain with rotation after session today. Pt encouraged to continue stretching, HEP, and to be mindful of posture with work duties. He has regained full cervical ROM, but complains of mild R soreness with end range R rotation. Pt will likely benefit from 1-2 more sessions then d/c to HEP.    Personal Factors and Comorbidities  Profession    Examination-Activity Limitations  Reach Overhead;Carry;Lift    Examination-Participation Restrictions  Cleaning;Community Activity;Driving    Stability/Clinical Decision Making  Stable/Uncomplicated    Rehab Potential  Good    PT Frequency  2x / week    PT Duration  6 weeks    PT Treatment/Interventions  ADLs/Self Care Home Management;Cryotherapy;Electrical Stimulation;DME Instruction;Ultrasound;Traction;Moist Heat;Iontophoresis '4mg'$ /ml Dexamethasone;Functional mobility training;Therapeutic activities;Therapeutic exercise;Patient/family education;Neuromuscular re-education;Manual techniques;Scar mobilization;Passive range of motion;Dry needling;Spinal Manipulations;Joint Manipulations    Consulted and Agree with Plan of Care  Patient       Patient will benefit from skilled therapeutic intervention in order to improve the following  deficits and impairments:  Decreased range of motion, Impaired UE functional use, Increased muscle spasms, Decreased activity tolerance, Pain, Impaired flexibility, Improper body mechanics, Decreased mobility, Decreased strength  Visit Diagnosis: 1. Cervicalgia   2. Muscle  spasm of back        Problem List Patient Active Problem List   Diagnosis Date Noted  . Hyperlipidemia 09/18/2016  . Constipation, chronic 09/18/2016  . BPH (benign prostatic hyperplasia) 09/18/2016  . Hypertension, essential 10/03/2009    Lyndee Hensen, PT, DPT 8:24 AM  03/17/19    [facility] [place], [place], [address] Phone: [phone]   Fax:  [phone]  Name: Carlos Walker MRN: [id number] Date of Birth: 06-01-1961

## 2019-03-24 ENCOUNTER — Ambulatory Visit (INDEPENDENT_AMBULATORY_CARE_PROVIDER_SITE_OTHER): Payer: 59 | Admitting: Physical Therapy

## 2019-03-24 ENCOUNTER — Encounter: Payer: Self-pay | Admitting: Physical Therapy

## 2019-03-24 ENCOUNTER — Other Ambulatory Visit: Payer: Self-pay

## 2019-03-24 DIAGNOSIS — M542 Cervicalgia: Secondary | ICD-10-CM | POA: Diagnosis not present

## 2019-03-24 DIAGNOSIS — M62830 Muscle spasm of back: Secondary | ICD-10-CM

## 2019-03-24 NOTE — Therapy (Addendum)
[facility] [address] [place], [place], [address] Phone: [phone]   Fax:  [phone]  Physical Therapy Treatment  Patient Details  Name: Carlos Walker MRN: [id number] Date of Birth: 27-Apr-1961 Referring Provider (PT): [name]   Encounter Date: 03/24/2019  PT End of Session - 03/24/19 1523    Visit Number  6    Number of Visits  12    Date for PT Re-Evaluation  04/06/19    Authorization Type  [employer]    PT Start Time  1518    PT Stop Time  1556    PT Time Calculation (min)  38 min    Activity Tolerance  Patient tolerated treatment well    Behavior During Therapy  [facility] for tasks assessed/performed       Past Medical History:  Diagnosis Date  . Arthritis   . Hyperlipidemia   . Hypertension   . Pain in lower back    epidural for pain orthopedics    Past Surgical History:  Procedure Laterality Date  . CIRCUMCISION    . right side repair     as child from football injury  . TONSILLECTOMY    . WISDOM TOOTH EXTRACTION      There were no vitals filed for this visit.  Subjective Assessment - 03/24/19 1522    Subjective  Pt states pain is better, he has pain less often, and less intense. STill feels it sometimes on R with R rotation.    Currently in Pain?  Yes    Pain Score  3     Pain Location  Neck    Pain Orientation  Right    Pain Descriptors / Indicators  Aching    Pain Type  Acute pain    Pain Onset  More than a month ago    Pain Frequency  Intermittent                       [facility] Adult PT Treatment/Exercise - 03/24/19 1526      Exercises   Exercises  Neck      Neck Exercises: Theraband   Rows  20 reps;[place]    Shoulder External Rotation  20 reps;Red    Shoulder External Rotation Limitations  Bil    Horizontal ABduction  20 reps;Red    Other Theraband Exercises  Low Row RTB x20;       Neck Exercises: Standing   Other Standing Exercises  Wall push ups x20;       Neck Exercises:  Seated   Neck Retraction  15 reps      Neck Exercises: Supine   Neck Retraction  --      Manual Therapy   Manual Therapy  Soft tissue mobilization;Joint mobilization;Manual Traction;Passive ROM    Manual therapy comments  --    Joint Mobilization  PA mobs Gr 3;  Side glides;     Soft tissue mobilization  DTM to R cervical region , sub occipitals, and paraspinals., IASTM to R UT and levator    Passive ROM  PROM for R rotation with overpressure     Manual Traction  10 sec x5      Neck Exercises: Stretches   Upper Trapezius Stretch  3 reps;30 seconds;Left;Right    Corner Stretch  3 reps;30 seconds    Corner Stretch Limitations  Doorway/45 and 90 deg    Lower Cervical/Upper Thoracic Stretch  3 reps;20 seconds  PT Short Term Goals - 03/09/19 1558      PT SHORT TERM GOAL #1   Title  Pt to be independent with initial HEP    Time  2    Period  Weeks    Status  Achieved    Target Date  03/08/19      PT SHORT TERM GOAL #2   Title  Pt to report decreased pain in R side of neck, to 3/10    Time  2    Period  Weeks    Status  Partially Met    Target Date  03/08/19        PT Long Term Goals - 02/23/19 1148      PT LONG TERM GOAL #1   Title  Pt to be independent with long term HEP    Time  6    Period  Weeks    Status  New    Target Date  04/05/19      PT LONG TERM GOAL #2   Title  Pt to report decreased pain in neck pain to 0-2/10 with head turns and work duties    Time  6    Period  Weeks    Status  New    Target Date  04/05/19      PT LONG TERM GOAL #3   Title  Pt to demo improved soft tissue restrictions to be [facility] for R neck and shoulder    Time  6    Period  Weeks    Status  New    Target Date  04/05/19      PT LONG TERM GOAL #4   Title  Pt to demo improved cervical rotation ROM, to be WNL for pt age and dx.    Time  6    Period  Weeks    Status  New    Target Date  04/05/19            Plan - 03/24/19 1617    Clinical  Impression Statement  Focus on releasing muscle tightness in R cervical region today. Pt declines DN. Pt with imroved muslce tension and trigger points in R UT, now more into paraspinal region. He is making good progress. Has ROM WNL, no pain with R rotation today after session. Pt to be seen for 1 more visit, will finalize HEP, and plan to d/c to HEP.    Personal Factors and Comorbidities  Profession    Examination-Activity Limitations  Reach Overhead;Carry;Lift    Examination-Participation Restrictions  Cleaning;Community Activity;Driving    Stability/Clinical Decision Making  Stable/Uncomplicated    Rehab Potential  Good    PT Frequency  2x / week    PT Duration  6 weeks    PT Treatment/Interventions  ADLs/Self Care Home Management;Cryotherapy;Electrical Stimulation;DME Instruction;Ultrasound;Traction;Moist Heat;Iontophoresis '4mg'$ /ml Dexamethasone;Functional mobility training;Therapeutic activities;Therapeutic exercise;Patient/family education;Neuromuscular re-education;Manual techniques;Scar mobilization;Passive range of motion;Dry needling;Spinal Manipulations;Joint Manipulations    Consulted and Agree with Plan of Care  Patient       Patient will benefit from skilled therapeutic intervention in order to improve the following deficits and impairments:  Decreased range of motion, Impaired UE functional use, Increased muscle spasms, Decreased activity tolerance, Pain, Impaired flexibility, Improper body mechanics, Decreased mobility, Decreased strength  Visit Diagnosis: 1. Cervicalgia   2. Muscle spasm of back        Problem List Patient Active Problem List   Diagnosis Date Noted  . Hyperlipidemia 09/18/2016  . Constipation, chronic 09/18/2016  .  BPH (benign prostatic hyperplasia) 09/18/2016  . Hypertension, essential 10/03/2009    Lyndee Hensen, PT, DPT 4:19 PM  03/24/19    [facility] [place], [place],  [address] Phone: [phone]   Fax:  [phone]  Name: MD SMOLA MRN: [id number] Date of Birth: [birth year]   PHYSICAL THERAPY DISCHARGE SUMMARY  Visits from Start of Care: 6 Plan: Patient agrees to discharge.  Patient goals were met. Patient is being discharged due to meeting the stated rehab goals.  ?????     Pt did not return for final visit, but overall was having minimal pain and doing well with HEP.   Lyndee Hensen, PT, DPT 3:33 PM  04/25/19

## 2019-04-06 ENCOUNTER — Other Ambulatory Visit: Payer: Self-pay

## 2019-04-06 ENCOUNTER — Ambulatory Visit (INDEPENDENT_AMBULATORY_CARE_PROVIDER_SITE_OTHER): Payer: 59 | Admitting: Adult Health

## 2019-04-06 ENCOUNTER — Encounter: Payer: Self-pay | Admitting: Adult Health

## 2019-04-06 VITALS — BP 140/96 | Temp 98.4°F | Ht 74.0 in | Wt 211.0 lb

## 2019-04-06 DIAGNOSIS — N401 Enlarged prostate with lower urinary tract symptoms: Secondary | ICD-10-CM | POA: Diagnosis not present

## 2019-04-06 DIAGNOSIS — I10 Essential (primary) hypertension: Secondary | ICD-10-CM

## 2019-04-06 DIAGNOSIS — N529 Male erectile dysfunction, unspecified: Secondary | ICD-10-CM | POA: Diagnosis not present

## 2019-04-06 DIAGNOSIS — Z0000 Encounter for general adult medical examination without abnormal findings: Secondary | ICD-10-CM

## 2019-04-06 DIAGNOSIS — R3914 Feeling of incomplete bladder emptying: Secondary | ICD-10-CM | POA: Diagnosis not present

## 2019-04-06 DIAGNOSIS — Z23 Encounter for immunization: Secondary | ICD-10-CM | POA: Diagnosis not present

## 2019-04-06 LAB — CBC WITH DIFFERENTIAL/PLATELET
Basophils Absolute: 0 10*3/uL (ref 0.0–0.1)
Basophils Relative: 0.6 % (ref 0.0–3.0)
Eosinophils Absolute: 0.1 10*3/uL (ref 0.0–0.7)
Eosinophils Relative: 1.5 % (ref 0.0–5.0)
HCT: 44.3 % (ref 39.0–52.0)
Hemoglobin: 14.3 g/dL (ref 13.0–17.0)
Lymphocytes Relative: 40 % (ref 12.0–46.0)
Lymphs Abs: 1.7 10*3/uL (ref 0.7–4.0)
MCHC: 32.3 g/dL (ref 30.0–36.0)
MCV: 89.9 fl (ref 78.0–100.0)
Monocytes Absolute: 0.5 10*3/uL (ref 0.1–1.0)
Monocytes Relative: 12.3 % — ABNORMAL HIGH (ref 3.0–12.0)
Neutro Abs: 1.9 10*3/uL (ref 1.4–7.7)
Neutrophils Relative %: 45.6 % (ref 43.0–77.0)
Platelets: 182 10*3/uL (ref 150.0–400.0)
RBC: 4.93 Mil/uL (ref 4.22–5.81)
RDW: 14.7 % (ref 11.5–15.5)
WBC: 4.2 10*3/uL (ref 4.0–10.5)

## 2019-04-06 LAB — COMPREHENSIVE METABOLIC PANEL
ALT: 12 U/L (ref 0–53)
AST: 13 U/L (ref 0–37)
Albumin: 4.4 g/dL (ref 3.5–5.2)
Alkaline Phosphatase: 53 U/L (ref 39–117)
BUN: 17 mg/dL (ref 6–23)
CO2: 30 mEq/L (ref 19–32)
Calcium: 9.5 mg/dL (ref 8.4–10.5)
Chloride: 105 mEq/L (ref 96–112)
Creatinine, Ser: 1.3 mg/dL (ref 0.40–1.50)
GFR: 68.43 mL/min (ref >60.00)
Glucose, Bld: 89 mg/dL (ref 70–99)
Potassium: 4.7 mEq/L (ref 3.5–5.1)
Sodium: 142 mEq/L (ref 135–145)
Total Bilirubin: 0.6 mg/dL (ref 0.2–1.2)
Total Protein: 6.9 g/dL (ref 6.0–8.3)

## 2019-04-06 LAB — PSA: PSA: 1.34 ng/mL (ref 0.10–4.00)

## 2019-04-06 LAB — LIPID PANEL
Cholesterol: 244 mg/dL — ABNORMAL HIGH (ref 0–200)
HDL: 53.4 mg/dL (ref >39.00)
LDL Cholesterol: 175 mg/dL — ABNORMAL HIGH (ref 0–99)
NonHDL: 190.71
Total CHOL/HDL Ratio: 5
Triglycerides: 80 mg/dL (ref 0.0–149.0)
VLDL: 16 mg/dL (ref 0.0–40.0)

## 2019-04-06 LAB — TSH: TSH: 1.16 u[IU]/mL (ref 0.35–4.50)

## 2019-04-06 MED ORDER — AMLODIPINE BESYLATE 5 MG PO TABS: 5.0000 mg | ORAL_TABLET | Freq: Every day | ORAL | 1 refills | Status: DC

## 2019-04-06 NOTE — Addendum Note (Signed)
Addended by: Miles Costain T on: 04/06/2019 05:10 PM   Modules accepted: Orders

## 2019-04-06 NOTE — Progress Notes (Signed)
Subjective:    Patient ID: Carlos Walker, male    DOB: 09-03-1960, 58 y.o.   MRN: [id number]  HPI  Patient presents for yearly preventative medicine examination. He is a pleasant 58 year old male who  has a past medical history of Arthritis, Hyperlipidemia, Hypertension, and Pain in lower back.  Hypertension - Takes lisinopril 10 mg daily.  He denies chest pain, shortness of breath, dizziness, lightheadedness, or headaches.He did not take his medication today.  BP Readings from Last 3 Encounters:  04/06/19 (!) 140/96  02/02/19 132/80  11/03/17 126/86    BPH -controlled with Flomax 0.4 mg daily  ED- was prescribed Viagra but reports that this did not help at all. He continues to have trouble maintaining an erection and getting an erection.  All immunizations and health maintenance protocols were reviewed with the patient and needed orders were placed.  Appropriate screening laboratory values were ordered for the patient including screening of hyperlipidemia, renal function and hepatic function. If indicated by BPH, a PSA was ordered.  Medication reconciliation,  past medical history, social history, problem list and allergies were reviewed in detail with the patient  Goals were established with regard to weight loss, exercise, and  diet in compliance with medications  End of life planning was discussed.  He is up-to-date on routine screening colonoscopies, his next due in May 2022  Review of Systems  Constitutional: Negative.   HENT: Negative.   Eyes: Negative.   Respiratory: Negative.   Cardiovascular: Negative.   Gastrointestinal: Negative.   Endocrine: Negative.   Genitourinary: Negative.   Musculoskeletal: Negative.   Skin: Negative.   Allergic/Immunologic: Negative.   Neurological: Negative.   Hematological: Negative.   Psychiatric/Behavioral: Negative.   All other systems reviewed and are negative.  Past Medical History:  Diagnosis Date  . Arthritis   .  Hyperlipidemia   . Hypertension   . Pain in lower back    epidural for pain orthopedics    Social History   Socioeconomic History  . Marital status: Single    Spouse name: Not on file  . Number of children: Not on file  . Years of education: Not on file  . Highest education level: Not on file  Occupational History  . Not on file  Social Needs  . Financial resource strain: Not on file  . Food insecurity    Worry: Not on file    Inability: Not on file  . Transportation needs    Medical: Not on file    Non-medical: Not on file  Tobacco Use  . Smoking status: Former Smoker    Packs/day: 0.25    Years: 20.00    Pack years: 5.00    Types: Cigarettes    Quit date: 09/20/2016    Years since quitting: 2.5  . Smokeless tobacco: Never Used  . Tobacco comment: less than 1/2 pack per day  Substance and Sexual Activity  . Alcohol use: No  . Drug use: No  . Sexual activity: Not on file  Lifestyle  . Physical activity    Days per week: Not on file    Minutes per session: Not on file  . Stress: Not on file  Relationships  . Social Herbalist on phone: Not on file    Gets together: Not on file    Attends religious service: Not on file    Active member of club or organization: Not on file    Attends meetings of  clubs or organizations: Not on file    Relationship status: Not on file  . Intimate partner violence    Fear of current or ex partner: Not on file    Emotionally abused: Not on file    Physically abused: Not on file    Forced sexual activity: Not on file  Other Topics Concern  . Not on file  Social History Narrative  . Not on file    Past Surgical History:  Procedure Laterality Date  . CIRCUMCISION    . right side repair     as child from football injury  . TONSILLECTOMY    . WISDOM TOOTH EXTRACTION      Family History  Problem Relation Age of Onset  . Diabetes Mother   . Heart disease Mother   . Hyperlipidemia Mother   . Stroke Mother   .  Heart disease Father   . Heart attack Father   . Heart disease Brother   . Renal Disease Brother   . Hyperlipidemia Sister     No Known Allergies  Current Outpatient Medications on File Prior to Visit  Medication Sig Dispense Refill  . lisinopril (ZESTRIL) 10 MG tablet TAKE 1 TABLET BY MOUTH EVERY DAY 90 tablet 0  . pramoxine-hydrocortisone (ANALPRAM-HC) 1-1 % rectal cream Place 1 application rectally 2 (two) times daily. 30 g 1  . sildenafil (VIAGRA) 25 MG tablet Take 1-2 tablets (25-50 mg total) by mouth daily as needed for erectile dysfunction. 30 tablet 3  . tamsulosin (FLOMAX) 0.4 MG CAPS capsule Take 1 capsule (0.4 mg total) by mouth daily. 90 capsule 3   Current Facility-Administered Medications on File Prior to Visit  Medication Dose Route Frequency Provider Last Rate Last Dose  . 0.9 %  sodium chloride infusion  500 mL Intravenous Continuous Gatha Mayer, MD        BP (!) 140/96   Temp 98.4 F (36.9 C)   Ht 6\' 2"  (1.88 m)   Wt 211 lb (95.7 kg)   BMI 27.09 kg/m       Objective:   Physical Exam Vitals signs and nursing note reviewed.  Constitutional:      General: He is not in acute distress.    Appearance: Normal appearance. He is not diaphoretic.  HENT:     Head: Normocephalic and atraumatic.     Right Ear: Tympanic membrane, ear canal and external ear normal. There is no impacted cerumen.     Left Ear: Tympanic membrane, ear canal and external ear normal. There is no impacted cerumen.     Nose: Nose normal. No congestion or rhinorrhea.     Mouth/Throat:     Mouth: Mucous membranes are moist.     Pharynx: Oropharynx is clear. No oropharyngeal exudate.  Eyes:     General: No scleral icterus.       Right eye: No discharge.        Left eye: No discharge.     Conjunctiva/sclera: Conjunctivae normal.     Pupils: Pupils are equal, round, and reactive to light.  Neck:     Musculoskeletal: Normal range of motion and neck supple.     Thyroid: No thyromegaly.      Vascular: No JVD.     Trachea: No tracheal deviation.  Cardiovascular:     Rate and Rhythm: Normal rate and regular rhythm.     Pulses: Normal pulses.     Heart sounds: Normal heart sounds. No murmur. No friction rub. No gallop.  Pulmonary:     Effort: Pulmonary effort is normal. No respiratory distress.     Breath sounds: Normal breath sounds. No stridor. No wheezing or rales.  Chest:     Chest wall: No tenderness.  Abdominal:     General: Bowel sounds are normal. There is no distension.     Palpations: Abdomen is soft. There is no mass.     Tenderness: There is no abdominal tenderness. There is no right CVA tenderness, left CVA tenderness, guarding or rebound.     Hernia: No hernia is present.  Musculoskeletal: Normal range of motion.        General: No swelling, tenderness, deformity or signs of injury.     Right lower leg: No edema.     Left lower leg: No edema.  Lymphadenopathy:     Cervical: No cervical adenopathy.  Skin:    General: Skin is warm and dry.     Capillary Refill: Capillary refill takes less than 2 seconds.     Coloration: Skin is not jaundiced or pale.     Findings: No bruising, erythema, lesion or rash.  Neurological:     General: No focal deficit present.     Mental Status: He is alert and oriented to person, place, and time. Mental status is at baseline.     Cranial Nerves: No cranial nerve deficit.     Motor: No abnormal muscle tone.     Coordination: Coordination normal.     Deep Tendon Reflexes: Reflexes are normal and symmetric. Reflexes normal.  Psychiatric:        Mood and Affect: Mood normal.        Behavior: Behavior normal.        Thought Content: Thought content normal.        Judgment: Judgment normal.       Assessment & Plan:  1. Routine general medical examination at a health care facility  - amLODipine (NORVASC) 5 MG tablet; Take 1 tablet (5 mg total) by mouth daily.  Dispense: 30 tablet; Refill: 1 - CBC with Differential/Platelet  - Comprehensive metabolic panel - Lipid panel - TSH  2. Hypertension, essential - Will d/c lisinopril  - Follow up in two weeks  - amLODipine (NORVASC) 5 MG tablet; Take 1 tablet (5 mg total) by mouth daily.  Dispense: 30 tablet; Refill: 1 - CBC with Differential/Platelet - Comprehensive metabolic panel - Lipid panel - TSH  3. Benign prostatic hyperplasia with incomplete bladder emptying  - PSA  4. Erectile dysfunction, unspecified erectile dysfunction type - Will d/c lisinopril and trial him on Norvasc to see if it is his BP medication causing ED - Follow up in two weeks or sooner if needed - Consider referral to urology  - PSA  [name], NP

## 2019-04-06 NOTE — Patient Instructions (Addendum)
It was great seeing you today   We will follow up with you regarding your blood work   I have changed your blood pressure medication, please follow up in two weeks for reevaluation

## 2019-04-21 ENCOUNTER — Encounter: Payer: Self-pay | Admitting: Adult Health

## 2019-04-21 ENCOUNTER — Other Ambulatory Visit: Payer: Self-pay

## 2019-04-21 ENCOUNTER — Ambulatory Visit: Payer: 59 | Admitting: Adult Health

## 2019-04-21 VITALS — BP 116/80 | Temp 98.3°F | Wt 211.0 lb

## 2019-04-21 DIAGNOSIS — N529 Male erectile dysfunction, unspecified: Secondary | ICD-10-CM

## 2019-04-21 DIAGNOSIS — I10 Essential (primary) hypertension: Secondary | ICD-10-CM

## 2019-04-21 NOTE — Progress Notes (Signed)
Subjective:    Patient ID: Carlos Walker, male    DOB: 1961-07-14, 58 y.o.   MRN: [id number]  HPI 57 year old male who  has a past medical history of Arthritis, Hyperlipidemia, Hypertension, and Pain in lower back.  He presents to the office today for follow up regarding hypertension and ED. A few weeks ago his blood pressure medication was changed from lisinopril to Norvasc to see if this would help with his ED issue.    He reports that since starting Norvasc his erectile dysfunction has not improved.  He denies side effects of the medication such as dizziness, lightheadedness, or lower extremity edema.  Viagra is not helping with his erectile dysfunction, and his testosterone levels were checked a year ago which were normal.  He would like to see a urologist for further evaluation    Review of Systems See HPI   Past Medical History:  Diagnosis Date  . Arthritis   . Hyperlipidemia   . Hypertension   . Pain in lower back    epidural for pain orthopedics    Social History   Socioeconomic History  . Marital status: Single    Spouse name: Not on file  . Number of children: Not on file  . Years of education: Not on file  . Highest education level: Not on file  Occupational History  . Not on file  Social Needs  . Financial resource strain: Not on file  . Food insecurity    Worry: Not on file    Inability: Not on file  . Transportation needs    Medical: Not on file    Non-medical: Not on file  Tobacco Use  . Smoking status: Former Smoker    Packs/day: 0.25    Years: 20.00    Pack years: 5.00    Types: Cigarettes    Quit date: 09/20/2016    Years since quitting: 2.5  . Smokeless tobacco: Never Used  . Tobacco comment: less than 1/2 pack per day  Substance and Sexual Activity  . Alcohol use: No  . Drug use: No  . Sexual activity: Not on file  Lifestyle  . Physical activity    Days per week: Not on file    Minutes per session: Not on file  . Stress: Not on file   Relationships  . Social Herbalist on phone: Not on file    Gets together: Not on file    Attends religious service: Not on file    Active member of club or organization: Not on file    Attends meetings of clubs or organizations: Not on file    Relationship status: Not on file  . Intimate partner violence    Fear of current or ex partner: Not on file    Emotionally abused: Not on file    Physically abused: Not on file    Forced sexual activity: Not on file  Other Topics Concern  . Not on file  Social History Narrative  . Not on file    Past Surgical History:  Procedure Laterality Date  . CIRCUMCISION    . right side repair     as child from football injury  . TONSILLECTOMY    . WISDOM TOOTH EXTRACTION      Family History  Problem Relation Age of Onset  . Diabetes Mother   . Heart disease Mother   . Hyperlipidemia Mother   . Stroke Mother   . Heart disease  Father   . Heart attack Father   . Heart disease Brother   . Renal Disease Brother   . Hyperlipidemia Sister     No Known Allergies  Current Outpatient Medications on File Prior to Visit  Medication Sig Dispense Refill  . amLODipine (NORVASC) 5 MG tablet Take 1 tablet (5 mg total) by mouth daily. 30 tablet 1  . pramoxine-hydrocortisone (ANALPRAM-HC) 1-1 % rectal cream Place 1 application rectally 2 (two) times daily. 30 g 1  . sildenafil (VIAGRA) 25 MG tablet Take 1-2 tablets (25-50 mg total) by mouth daily as needed for erectile dysfunction. 30 tablet 3  . tamsulosin (FLOMAX) 0.4 MG CAPS capsule Take 1 capsule (0.4 mg total) by mouth daily. 90 capsule 3   Current Facility-Administered Medications on File Prior to Visit  Medication Dose Route Frequency Provider Last Rate Last Dose  . 0.9 %  sodium chloride infusion  500 mL Intravenous Continuous Gatha Mayer, MD        BP 116/80   Temp 98.3 F (36.8 C)   Wt 211 lb (95.7 kg)   BMI 27.09 kg/m       Objective:   Physical Exam Vitals signs  and nursing note reviewed.  Constitutional:      Appearance: Normal appearance.  Cardiovascular:     Rate and Rhythm: Normal rate and regular rhythm.     Pulses: Normal pulses.     Heart sounds: Normal heart sounds.  Pulmonary:     Effort: Pulmonary effort is normal.     Breath sounds: Normal breath sounds.  Musculoskeletal:     Right lower leg: No edema.     Left lower leg: No edema.  Skin:    Capillary Refill: Capillary refill takes less than 2 seconds.  Neurological:     General: No focal deficit present.     Mental Status: He is alert and oriented to person, place, and time.  Psychiatric:        Mood and Affect: Mood normal.        Behavior: Behavior normal.        Thought Content: Thought content normal.        Judgment: Judgment normal.       Assessment & Plan:  1. Hypertension, essential - well controlled on Norvasc - Will keep him on this medication   2. Erectile dysfunction, unspecified erectile dysfunction type - Ambulatory referral to Urology   [name], NP

## 2019-05-01 ENCOUNTER — Other Ambulatory Visit: Payer: Self-pay | Admitting: Adult Health

## 2019-05-01 DIAGNOSIS — I10 Essential (primary) hypertension: Secondary | ICD-10-CM

## 2019-05-01 DIAGNOSIS — Z0000 Encounter for general adult medical examination without abnormal findings: Secondary | ICD-10-CM

## 2019-05-03 NOTE — Telephone Encounter (Signed)
Sent to the pharmacy by e-scribe for 1 year. 

## 2019-05-03 NOTE — Telephone Encounter (Signed)
DENIED.  THIS MEDICATION HAS BEEN DISCONTINUED. 

## 2019-05-05 ENCOUNTER — Other Ambulatory Visit: Payer: Self-pay | Admitting: Adult Health

## 2019-05-05 DIAGNOSIS — R3914 Feeling of incomplete bladder emptying: Secondary | ICD-10-CM

## 2019-05-05 DIAGNOSIS — N401 Enlarged prostate with lower urinary tract symptoms: Secondary | ICD-10-CM

## 2019-05-05 NOTE — Telephone Encounter (Signed)
Medication refill: tamsulosin (FLOMAX) 0.4 MG CAPS capsule [id number]    Pharmacy:  [id number] - [place], [address] [phone] (Phone) [phone] (Fax)   Pt aware of turn around time.

## 2019-05-05 NOTE — Telephone Encounter (Signed)
Requested medication (s) are due for refill today: yes  Requested medication (s) are on the active medication list:yes  Last refill:  12/03/2017  Future visit scheduled: no  Notes to clinic:  Review for refill   Requested Prescriptions  Pending Prescriptions Disp Refills   tamsulosin (FLOMAX) 0.4 MG CAPS capsule 90 capsule 3    Sig: Take 1 capsule (0.4 mg total) by mouth daily.     Urology: Alpha-Adrenergic Blocker Passed - 05/05/2019 11:44 AM      Passed - Last BP in normal range    BP Readings from Last 1 Encounters:  04/21/19 116/80         Passed - Valid encounter within last 12 months    Recent Outpatient Visits          2 weeks ago Hypertension, essential   Therapist, [facility] at [place], NP   4 weeks ago Routine general medical examination at a health care facility   [employer] [facility], [place], NP   3 months ago Neck pain   Therapist, music at [place], NP   3 months ago Strain of neck muscle, initial Education administrator at [facility], [place], NP   1 year ago Routine general medical examination at a health care facility   [employer] [facility], [place], NP

## 2019-05-06 MED ORDER — TAMSULOSIN HCL 0.4 MG PO CAPS: 0.4000 mg | ORAL_CAPSULE | Freq: Every day | ORAL | 3 refills | Status: DC

## 2019-05-06 NOTE — Telephone Encounter (Signed)
Sent to the pharmacy by e-scribe. 

## 2019-05-06 NOTE — Telephone Encounter (Signed)
Please advise 

## 2019-05-13 ENCOUNTER — Ambulatory Visit (INDEPENDENT_AMBULATORY_CARE_PROVIDER_SITE_OTHER): Payer: 59

## 2019-05-13 ENCOUNTER — Encounter (HOSPITAL_COMMUNITY): Payer: Self-pay

## 2019-05-13 ENCOUNTER — Ambulatory Visit (HOSPITAL_COMMUNITY)
Admission: EM | Admit: 2019-05-13 | Discharge: 2019-05-13 | Disposition: A | Discharge status: Home / Self Care | Payer: 59 | Attending: Family Medicine | Admitting: Family Medicine

## 2019-05-13 ENCOUNTER — Other Ambulatory Visit: Payer: Self-pay

## 2019-05-13 DIAGNOSIS — Z87891 Personal history of nicotine dependence: Secondary | ICD-10-CM | POA: Insufficient documentation

## 2019-05-13 DIAGNOSIS — M545 Low back pain: Secondary | ICD-10-CM | POA: Diagnosis not present

## 2019-05-13 DIAGNOSIS — E785 Hyperlipidemia, unspecified: Secondary | ICD-10-CM | POA: Diagnosis not present

## 2019-05-13 DIAGNOSIS — M791 Myalgia, unspecified site: Secondary | ICD-10-CM

## 2019-05-13 DIAGNOSIS — R0981 Nasal congestion: Secondary | ICD-10-CM

## 2019-05-13 DIAGNOSIS — R0602 Shortness of breath: Secondary | ICD-10-CM | POA: Diagnosis not present

## 2019-05-13 DIAGNOSIS — Z20828 Contact with and (suspected) exposure to other viral communicable diseases: Secondary | ICD-10-CM | POA: Diagnosis not present

## 2019-05-13 DIAGNOSIS — R05 Cough: Secondary | ICD-10-CM

## 2019-05-13 DIAGNOSIS — N400 Enlarged prostate without lower urinary tract symptoms: Secondary | ICD-10-CM | POA: Insufficient documentation

## 2019-05-13 DIAGNOSIS — R6889 Other general symptoms and signs: Secondary | ICD-10-CM

## 2019-05-13 DIAGNOSIS — I10 Essential (primary) hypertension: Secondary | ICD-10-CM | POA: Insufficient documentation

## 2019-05-13 DIAGNOSIS — Z79899 Other long term (current) drug therapy: Secondary | ICD-10-CM | POA: Diagnosis not present

## 2019-05-13 DIAGNOSIS — M1990 Unspecified osteoarthritis, unspecified site: Secondary | ICD-10-CM | POA: Insufficient documentation

## 2019-05-13 MED ORDER — ONDANSETRON 4 MG PO TBDP
4.0000 mg | ORAL_TABLET | Freq: Once | ORAL | Status: AC
  Administered 2019-05-13: 4 mg via ORAL

## 2019-05-13 MED ORDER — ONDANSETRON 4 MG PO TBDP
ORAL_TABLET | ORAL | Status: AC
  Filled 2019-05-13: qty 1

## 2019-05-13 MED ORDER — KETOROLAC TROMETHAMINE 60 MG/2ML IM SOLN
INTRAMUSCULAR | Status: AC
  Filled 2019-05-13: qty 2

## 2019-05-13 MED ORDER — KETOROLAC TROMETHAMINE 60 MG/2ML IM SOLN
60.0000 mg | Freq: Once | INTRAMUSCULAR | Status: AC
  Administered 2019-05-13: 12:00:00 60 mg via INTRAMUSCULAR

## 2019-05-13 NOTE — Discharge Instructions (Signed)
Your chest xray is normal today which is reassuring.  Covid testing is in process, we will call you if this returns positive.  Self isolate until results are back and negative.   You may monitor your results on your MyChart online as well.    Push fluids to ensure adequate hydration and keep secretions thin.  Tylenol and/or ibuprofen as needed for pain or fevers.  I do expect improvement in the next week.  If any worsening of shortness of breath , chest pain , or uncontrolled fevers please return or go to the Er.

## 2019-05-13 NOTE — ED Triage Notes (Signed)
Pt report having generalized body aches, chills, cough and nasal congestion x 2 days. Pt is taking Tylenol.

## 2019-05-13 NOTE — ED Provider Notes (Signed)
[place]    CSN: [id number] Arrival date & time: 05/13/19  1051      History   Chief Complaint Chief Complaint  Patient presents with  . Generalized Body Aches  . Chills  . Cough  . Nasal Congestion    HPI Carlos Walker is a 58 y.o. male.   Carlos Walker presents with complaints of body aches, all over. Shortness of breath occasionally which comes and goes. Started two days ago. No known fever. No headache. Slight cough initially, this has improved. Cut the grass two days ago before symptoms started. Nasal congestion initially, this has improved. No sore throat. No ear pain. Nausea, no vomiting or diarrhea. Eating and drinking. States feels flu like. Has taken tylenol, last dose last night. Didn't necessarily help. No known ill contacts. Works at [employer]. Has a PCP. Doesn't smoke. Currently feels shortness of breath . No recent travel. No leg pain or swelling.    ROS per HPI, negative if not otherwise mentioned.      Past Medical History:  Diagnosis Date  . Arthritis   . Hyperlipidemia   . Hypertension   . Pain in lower back    epidural for pain orthopedics    Patient Active Problem List   Diagnosis Date Noted  . Hyperlipidemia 09/18/2016  . Constipation, chronic 09/18/2016  . BPH (benign prostatic hyperplasia) 09/18/2016  . Hypertension, essential 10/03/2009    Past Surgical History:  Procedure Laterality Date  . CIRCUMCISION    . right side repair     as child from football injury  . TONSILLECTOMY    . WISDOM TOOTH EXTRACTION         Home Medications    Prior to Admission medications   Medication Sig Start Date End Date Taking? Authorizing Provider  acetaminophen (TYLENOL) 500 MG tablet Take 500 mg by mouth every 6 (six) hours as needed.   Yes [provider]  amLODipine (NORVASC) 5 MG tablet TAKE 1 TABLET BY MOUTH EVERY DAY 05/03/19   Nafziger, Tommi Rumps, NP  pramoxine-hydrocortisone [facility]) 1-1 % rectal cream  Place 1 application rectally 2 (two) times daily. 03/09/18   Nafziger, Tommi Rumps, NP  sildenafil (VIAGRA) 25 MG tablet Take 1-2 tablets (25-50 mg total) by mouth daily as needed for erectile dysfunction. 03/18/18   Nafziger, Tommi Rumps, NP  tamsulosin (FLOMAX) 0.4 MG CAPS capsule Take 1 capsule (0.4 mg total) by mouth daily. 05/06/19   [name], NP    Family History Family History  Problem Relation Age of Onset  . Diabetes Mother   . Heart disease Mother   . Hyperlipidemia Mother   . Stroke Mother   . Heart disease Father   . Heart attack Father   . Heart disease Brother   . Renal Disease Brother   . Hyperlipidemia Sister     Social History Social History   Tobacco Use  . Smoking status: Former Smoker    Packs/day: 0.25    Years: 20.00    Pack years: 5.00    Types: Cigarettes    Quit date: 09/20/2016    Years since quitting: 2.6  . Smokeless tobacco: Never Used  . Tobacco comment: less than 1/2 pack per day  Substance Use Topics  . Alcohol use: No  . Drug use: No     Allergies   Patient has no known allergies.   Review of Systems Review of Systems   Physical Exam Triage Vital Signs ED Triage Vitals  Enc Vitals  Group     BP 05/13/19 1113 124/75     Pulse Rate 05/13/19 1113 83     Resp 05/13/19 1113 16     Temp 05/13/19 1113 98 F (36.7 C)     Temp Source 05/13/19 1113 Temporal     SpO2 05/13/19 1113 98 %     Weight --      Height --      Head Circumference --      Peak Flow --      Pain Score 05/13/19 1110 6     Pain Loc --      Pain Edu? --      Excl. in [place]? --    No data found.  Updated Vital Signs BP 124/75 (BP Location: Right Arm)   Pulse 83   Temp 98 F (36.7 C) (Temporal)   Resp 16   SpO2 98%    Physical Exam Constitutional:      Appearance: He is well-developed.  Cardiovascular:     Rate and Rhythm: Normal rate and regular rhythm.  Pulmonary:     Effort: Pulmonary effort is normal.     Breath sounds: Normal breath sounds.  Skin:     General: Skin is warm and dry.  Neurological:     Mental Status: He is alert and oriented to person, place, and time.      UC Treatments / Results  Labs (all labs ordered are listed, but only abnormal results are displayed) Labs Reviewed  NOVEL CORONAVIRUS, NAA (HOSP ORDER, SEND-OUT TO REF LAB; TAT 18-24 HRS)    EKG   Radiology Dg Chest 2 View  Result Date: 05/13/2019 CLINICAL DATA:  Shortness of breath and cough for 2 days EXAM: CHEST - 2 VIEW COMPARISON:  None. FINDINGS: The heart size and mediastinal contours are within normal limits. Both lungs are clear. The visualized skeletal structures are unremarkable. IMPRESSION: No active cardiopulmonary disease. Electronically Signed   By: Inez Catalina M.D.   On: 05/13/2019 12:36    Procedures Procedures (including critical care time)  Medications Ordered in UC Medications  ketorolac (TORADOL) injection 60 mg (60 mg Intramuscular Given 05/13/19 1211)  ondansetron (ZOFRAN-ODT) disintegrating tablet 4 mg (4 mg Oral Given 05/13/19 1211)  ondansetron (ZOFRAN-ODT) 4 MG disintegrating tablet (has no administration in time range)  ketorolac (TORADOL) 60 MG/2ML injection (has no administration in time range)    Initial Impression / Assessment and Plan / UC Course  I have reviewed the triage vital signs and the nursing notes.  Pertinent labs & imaging results that were available during my care of the patient were reviewed by me and considered in my medical decision making (see chart for details).     Non toxic, afebrile. No increased work of breathing. Ambulatory without difficulty. Patient endorses improvement of body aches after toradol administration here in clinic. Chest xray clear. covid pending. Will notify of any positive findings and if any changes to treatment are needed.  Self isolation recommended until this results. Return precautions provided. Patient verbalized understanding and agreeable to plan.  Ambulatory out of clinic  without difficulty.    Final Clinical Impressions(s) / UC Diagnoses   Final diagnoses:  Flu-like symptoms     Discharge Instructions     Your chest xray is normal today which is reassuring.  Covid testing is in process, we will call you if this returns positive.  Self isolate until results are back and negative.   You may monitor your results on  your MyChart online as well.    Push fluids to ensure adequate hydration and keep secretions thin.  Tylenol and/or ibuprofen as needed for pain or fevers.  I do expect improvement in the next week.  If any worsening of shortness of breath , chest pain , or uncontrolled fevers please return or go to the Er.     ED Prescriptions    None     PDMP not reviewed this encounter.   Zigmund Gottron, NP 05/13/19 1334

## 2019-05-16 ENCOUNTER — Encounter (HOSPITAL_COMMUNITY): Payer: Self-pay

## 2019-05-16 ENCOUNTER — Encounter: Payer: Self-pay | Admitting: Family Medicine

## 2019-05-16 ENCOUNTER — Telehealth (INDEPENDENT_AMBULATORY_CARE_PROVIDER_SITE_OTHER): Payer: 59 | Admitting: Family Medicine

## 2019-05-16 ENCOUNTER — Other Ambulatory Visit: Payer: Self-pay

## 2019-05-16 ENCOUNTER — Telehealth (HOSPITAL_COMMUNITY): Payer: Self-pay | Admitting: Emergency Medicine

## 2019-05-16 DIAGNOSIS — U071 COVID-19: Secondary | ICD-10-CM

## 2019-05-16 NOTE — Telephone Encounter (Signed)
Positive covid, Patient contacted and made aware of    results, all questions answered Mychart notes sent, info about retesting sent.

## 2019-05-16 NOTE — Progress Notes (Signed)
Virtual Visit via Video Note  I connected with the patient on 05/16/19 at  2:30 PM EDT by a video enabled telemedicine application and verified that I am speaking with the correct person using two identifiers.  Location patient: home Location provider:work or home office Persons participating in the virtual visit: patient, provider  I discussed the limitations of evaluation and management by telemedicine and the availability of in person appointments. The patient expressed understanding and agreed to proceed.   HPI: Here to follow up on an ER visit on 05-13-19. The day before that he had developed body aches, SOB with a dry cough, chills (without a fever), and fatigue. No headache or ST or chest pain. No loss of taste or smell. No NVD. His CXR that day was clear. He tested positive for the Covid-19 virus and he was told to quarantine at home for 10 days. He feel back to normal today. He is resting and drinking fluids. No one else in his household has been ill.    ROS: See pertinent positives and negatives per HPI.  Past Medical History:  Diagnosis Date  . Arthritis   . Hyperlipidemia   . Hypertension   . Pain in lower back    epidural for pain orthopedics    Past Surgical History:  Procedure Laterality Date  . CIRCUMCISION    . right side repair     as child from football injury  . TONSILLECTOMY    . WISDOM TOOTH EXTRACTION      Family History  Problem Relation Age of Onset  . Diabetes Mother   . Heart disease Mother   . Hyperlipidemia Mother   . Stroke Mother   . Heart disease Father   . Heart attack Father   . Heart disease Brother   . Renal Disease Brother   . Hyperlipidemia Sister      Current Outpatient Medications:  .  acetaminophen (TYLENOL) 500 MG tablet, Take 500 mg by mouth every 6 (six) hours as needed., Disp: , Rfl:  .  amLODipine (NORVASC) 5 MG tablet, TAKE 1 TABLET BY MOUTH EVERY DAY, Disp: 90 tablet, Rfl: 3 .  pramoxine-hydrocortisone (ANALPRAM-HC) 1-1  % rectal cream, Place 1 application rectally 2 (two) times daily., Disp: 30 g, Rfl: 1 .  sildenafil (VIAGRA) 25 MG tablet, Take 1-2 tablets (25-50 mg total) by mouth daily as needed for erectile dysfunction., Disp: 30 tablet, Rfl: 3 .  tamsulosin (FLOMAX) 0.4 MG CAPS capsule, Take 1 capsule (0.4 mg total) by mouth daily., Disp: 90 capsule, Rfl: 3  Current Facility-Administered Medications:  .  0.9 %  sodium chloride infusion, 500 mL, Intravenous, Continuous, Carlean Purl, Ofilia Neas, MD  EXAM:  VITALS per patient if applicable:  GENERAL: alert, oriented, appears well and in no acute distress  HEENT: atraumatic, conjunttiva clear, no obvious abnormalities on inspection of external nose and ears  NECK: normal movements of the head and neck  LUNGS: on inspection no signs of respiratory distress, breathing rate appears normal, no obvious gross SOB, gasping or wheezing  CV: no obvious cyanosis  MS: moves all visible extremities without noticeable abnormality  PSYCH/NEURO: pleasant and cooperative, no obvious depression or anxiety, speech and thought processing grossly intact  ASSESSMENT AND PLAN: He is recovering from a Covid virus infection. He plans to return to work on 05-23-19. He will quarantine at home until then.  Alysia Penna, MD  Discussed the following assessment and plan:  No diagnosis found.     I discussed the  assessment and treatment plan with the patient. The patient was provided an opportunity to ask questions and all were answered. The patient agreed with the plan and demonstrated an understanding of the instructions.   The patient was advised to call back or seek an in-person evaluation if the symptoms worsen or if the condition fails to improve as anticipated.

## 2019-05-17 LAB — NOVEL CORONAVIRUS, NAA (HOSP ORDER, SEND-OUT TO REF LAB; TAT 18-24 HRS): SARS-CoV-2, NAA: DETECTED — AB

## 2019-05-18 ENCOUNTER — Telehealth (INDEPENDENT_AMBULATORY_CARE_PROVIDER_SITE_OTHER): Payer: 59 | Admitting: Adult Health

## 2019-05-18 ENCOUNTER — Encounter: Payer: Self-pay | Admitting: Adult Health

## 2019-05-18 ENCOUNTER — Other Ambulatory Visit: Payer: Self-pay

## 2019-05-18 DIAGNOSIS — U071 COVID-19: Secondary | ICD-10-CM

## 2019-05-18 NOTE — Progress Notes (Signed)
Virtual Visit via Telephone Note  I connected with Carlos Walker on 05/18/19 at  3:30 PM EDT by telephone and verified that I am speaking with the correct person using two identifiers.   I discussed the limitations, risks, security and privacy concerns of performing an evaluation and management service by telephone and the availability of in person appointments. I also discussed with the patient that there may be a patient responsible charge related to this service. The patient expressed understanding and agreed to proceed.  Location patient: home Location provider: work or home office Participants present for the call: patient, provider Patient did not have a visit in the prior 7 days to address this/these issue(s).   History of Present Illness: 58 year old male here for follow-up on the ER visit on 05/13/2019.  The day before his ER bill is that he developed body aches, shortness of breath with a dry cough, chills, and fatigue.  He had no fever no headache, or sore throat or chest pain.  He did not experience loss of taste or smell.  In the ER his chest x-ray was clear.  He did test positive for COVID-19 and was lysed to quarantine at home for 10 days.  He was seen by Dr. Sarajane Jews on 05/16/2019 for a follow-up visit at which time he reported that he felt back to normal and nobody in his house had been ill.  Today he reports that the rest of his family has tested positive, he has minor body aches but no fever, chills, or shortness of breath.  Is wondering if there is anything that I can prescribe for him to make his symptoms better   Observations/Objective: Patient sounds cheerful and well on the phone. I do not appreciate any SOB. Speech and thought processing are grossly intact. Patient reported vitals:  Assessment and Plan: 1. COVID-19 virus infection -Advised conservative measures, Tylenol/Motrin for symptom relief, rest, and stay well-hydrated.  He was advised to follow-up if symptoms  worsen  Follow Up Instructions:  I did not refer this patient for an OV in the next 24 hours for this/these issue(s).  I discussed the assessment and treatment plan with the patient. The patient was provided an opportunity to ask questions and all were answered. The patient agreed with the plan and demonstrated an understanding of the instructions.   The patient was advised to call back or seek an in-person evaluation if the symptoms worsen or if the condition fails to improve as anticipated.  I provided 15 minutes of non-face-to-face time during this encounter.   [name], NP

## 2019-05-20 ENCOUNTER — Telehealth: Payer: Self-pay | Admitting: Family Medicine

## 2019-05-20 NOTE — Telephone Encounter (Signed)
Copied from [place] [id number]. Topic: General - Other >> May 20, 2019  1:33 PM Rainey Pines A wrote: [place] disability claims is requesting a callback in regards to patients short term disability based on patients covid test results. Contact number is [phone] ext [phone] reference number [id number]

## 2019-05-25 NOTE — Telephone Encounter (Signed)
Forms received and faxed back to [place] to scan.  Nothing further needed.

## 2019-06-30 ENCOUNTER — Other Ambulatory Visit: Payer: Self-pay | Admitting: Adult Health

## 2019-06-30 DIAGNOSIS — S161XXA Strain of muscle, fascia and tendon at neck level, initial encounter: Secondary | ICD-10-CM

## 2019-07-10 ENCOUNTER — Other Ambulatory Visit: Payer: Self-pay | Admitting: Adult Health

## 2019-07-10 DIAGNOSIS — K219 Gastro-esophageal reflux disease without esophagitis: Secondary | ICD-10-CM

## 2019-10-25 ENCOUNTER — Other Ambulatory Visit: Payer: Self-pay

## 2019-10-26 ENCOUNTER — Other Ambulatory Visit (INDEPENDENT_AMBULATORY_CARE_PROVIDER_SITE_OTHER): Payer: BC Managed Care – PPO | Admitting: Adult Health

## 2019-10-26 ENCOUNTER — Encounter: Payer: Self-pay | Admitting: Adult Health

## 2019-10-26 ENCOUNTER — Ambulatory Visit: Payer: Self-pay | Admitting: Adult Health

## 2019-10-26 DIAGNOSIS — D1723 Benign lipomatous neoplasm of skin and subcutaneous tissue of right leg: Secondary | ICD-10-CM

## 2019-10-26 NOTE — Progress Notes (Signed)
Subjective:    Patient ID: Carlos Walker, male    DOB: 07/24/1961, 59 y.o.   MRN: [id number]  HPI 59 year old male who  has a past medical history of Arthritis, Hyperlipidemia, Hypertension, and Pain in lower back.  He presents to the office today for an acute issue "bump on my right knee".  He first noticed this mass approximately 3 weeks ago but it may have been present for over a year.  Reports that he was just rubbing his knee and felt he describes it is a fluid-filled blister ".  He does report some mild discomfort with palpation.  Has not noticed any redness.  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   Review of Systems See HPI   Past Medical History:  Diagnosis Date  . Arthritis   . Hyperlipidemia   . Hypertension   . Pain in lower back    epidural for pain orthopedics    Social History   Socioeconomic History  . Marital status: Married    Spouse name: Not on file  . Number of children: Not on file  . Years of education: Not on file  . Highest education level: Not on file  Occupational History  . Not on file  Tobacco Use  . Smoking status: Former Smoker    Packs/day: 0.25    Years: 20.00    Pack years: 5.00    Types: Cigarettes    Quit date: 09/20/2016    Years since quitting: 3.0  . Smokeless tobacco: Never Used  . Tobacco comment: less than 1/2 pack per day  Substance and Sexual Activity  . Alcohol use: No  . Drug use: No  . Sexual activity: Not on file  Other Topics Concern  . Not on file  Social History Narrative  . Not on file   Social Determinants of Health   Financial Resource Strain:   . Difficulty of Paying Living Expenses:   Food Insecurity:   . Worried About Charity fundraiser in the Last Year:   . Arboriculturist in the Last Year:   Transportation Needs:   . Film/video editor (Medical):   Marland Kitchen Lack of Transportation (Non-Medical):   Physical Activity:   . Days of Exercise per Week:   .  Minutes of Exercise per Session:   Stress:   . Feeling of Stress :   Social Connections:   . Frequency of Communication with Friends and Family:   . Frequency of Social Gatherings with Friends and Family:   . Attends Religious Services:   . Active Member of Clubs or Organizations:   . Attends Archivist Meetings:   Marland Kitchen Marital Status:   Intimate Partner Violence:   . Fear of Current or Ex-Partner:   . Emotionally Abused:   Marland Kitchen Physically Abused:   . Sexually Abused:     Past Surgical History:  Procedure Laterality Date  . CIRCUMCISION    . right side repair     as child from football injury  . TONSILLECTOMY    . WISDOM TOOTH EXTRACTION      Family History  Problem Relation Age of Onset  . Diabetes Mother   . Heart disease Mother   . Hyperlipidemia Mother   . Stroke Mother   . Heart disease Father   . Heart attack Father   . Heart disease Brother   . Renal Disease Brother   .  Hyperlipidemia Sister     No Known Allergies  Current Outpatient Medications on File Prior to Visit  Medication Sig Dispense Refill  . acetaminophen (TYLENOL) 500 MG tablet Take 500 mg by mouth every 6 (six) hours as needed.    Marland Kitchen amLODipine (NORVASC) 5 MG tablet TAKE 1 TABLET BY MOUTH EVERY DAY 90 tablet 3  . omeprazole (PRILOSEC) 20 MG capsule TAKE 1 CAPSULE BY MOUTH EVERY DAY 90 capsule 2  . pramoxine-hydrocortisone (ANALPRAM-HC) 1-1 % rectal cream Place 1 application rectally 2 (two) times daily. 30 g 1  . sildenafil (VIAGRA) 25 MG tablet Take 1-2 tablets (25-50 mg total) by mouth daily as needed for erectile dysfunction. 30 tablet 3  . tamsulosin (FLOMAX) 0.4 MG CAPS capsule Take 1 capsule (0.4 mg total) by mouth daily. 90 capsule 3   Current Facility-Administered Medications on File Prior to Visit  Medication Dose Route Frequency Provider Last Rate Last Admin  . 0.9 %  sodium chloride infusion  500 mL Intravenous Continuous Gatha Mayer, MD        There were no vitals taken for  this visit.      Objective:   Physical Exam Vitals and nursing note reviewed.  Constitutional:      Appearance: Normal appearance.  Cardiovascular:     Rate and Rhythm: Regular rhythm.  Skin:    General: Skin is warm and dry.     Comments: Quarter sized soft/rubber feeling mass over right patella.  Mass does move when pushed with fingers.  Neurological:     General: No focal deficit present.     Mental Status: He is alert and oriented to person, place, and time.  Psychiatric:        Mood and Affect: Mood normal.        Behavior: Behavior normal.        Thought Content: Thought content normal.        Judgment: Judgment normal.       Assessment & Plan:  1. Lipoma of right lower extremity -Likely lipoma, but will get ultrasound to get formal diagnosis.  More unlikely liposarcoma - [place] RT LOWER EXTREM LTD SOFT TISSUE NON VASCULAR; Future  [name], NP

## 2019-10-26 NOTE — Progress Notes (Deleted)
Subjective:    Patient ID: Carlos Walker, male    DOB: Dec 30, 1960, 59 y.o.   MRN: [id number]  HPI  59 year old male who  has a past medical history of Arthritis, Hyperlipidemia, Hypertension, and Pain in lower back.  He presents to the office today for an acute issue    Review of Systems See HPI   Past Medical History:  Diagnosis Date  . Arthritis   . Hyperlipidemia   . Hypertension   . Pain in lower back    epidural for pain orthopedics    Social History   Socioeconomic History  . Marital status: Married    Spouse name: Not on file  . Number of children: Not on file  . Years of education: Not on file  . Highest education level: Not on file  Occupational History  . Not on file  Tobacco Use  . Smoking status: Former Smoker    Packs/day: 0.25    Years: 20.00    Pack years: 5.00    Types: Cigarettes    Quit date: 09/20/2016    Years since quitting: 3.0  . Smokeless tobacco: Never Used  . Tobacco comment: less than 1/2 pack per day  Substance and Sexual Activity  . Alcohol use: No  . Drug use: No  . Sexual activity: Not on file  Other Topics Concern  . Not on file  Social History Narrative  . Not on file   Social Determinants of Health   Financial Resource Strain:   . Difficulty of Paying Living Expenses:   Food Insecurity:   . Worried About Charity fundraiser in the Last Year:   . Arboriculturist in the Last Year:   Transportation Needs:   . Film/video editor (Medical):   Marland Kitchen Lack of Transportation (Non-Medical):   Physical Activity:   . Days of Exercise per Week:   . Minutes of Exercise per Session:   Stress:   . Feeling of Stress :   Social Connections:   . Frequency of Communication with Friends and Family:   . Frequency of Social Gatherings with Friends and Family:   . Attends Religious Services:   . Active Member of Clubs or Organizations:   . Attends Archivist Meetings:   Marland Kitchen Marital Status:   Intimate Partner Violence:   .  Fear of Current or Ex-Partner:   . Emotionally Abused:   Marland Kitchen Physically Abused:   . Sexually Abused:     Past Surgical History:  Procedure Laterality Date  . CIRCUMCISION    . right side repair     as child from football injury  . TONSILLECTOMY    . WISDOM TOOTH EXTRACTION      Family History  Problem Relation Age of Onset  . Diabetes Mother   . Heart disease Mother   . Hyperlipidemia Mother   . Stroke Mother   . Heart disease Father   . Heart attack Father   . Heart disease Brother   . Renal Disease Brother   . Hyperlipidemia Sister     No Known Allergies  Current Outpatient Medications on File Prior to Visit  Medication Sig Dispense Refill  . acetaminophen (TYLENOL) 500 MG tablet Take 500 mg by mouth every 6 (six) hours as needed.    Marland Kitchen amLODipine (NORVASC) 5 MG tablet TAKE 1 TABLET BY MOUTH EVERY DAY 90 tablet 3  . omeprazole (PRILOSEC) 20 MG capsule TAKE 1 CAPSULE BY MOUTH EVERY  DAY 90 capsule 2  . pramoxine-hydrocortisone (ANALPRAM-HC) 1-1 % rectal cream Place 1 application rectally 2 (two) times daily. 30 g 1  . sildenafil (VIAGRA) 25 MG tablet Take 1-2 tablets (25-50 mg total) by mouth daily as needed for erectile dysfunction. 30 tablet 3  . tamsulosin (FLOMAX) 0.4 MG CAPS capsule Take 1 capsule (0.4 mg total) by mouth daily. 90 capsule 3   Current Facility-Administered Medications on File Prior to Visit  Medication Dose Route Frequency Provider Last Rate Last Admin  . 0.9 %  sodium chloride infusion  500 mL Intravenous Continuous Gatha Mayer, MD        BP 116/76   Temp 98.2 F (36.8 C)   Wt 217 lb (98.4 kg)   BMI 27.86 kg/m       Objective:   Physical Exam Vitals and nursing note reviewed.  Constitutional:      Appearance: Normal appearance.  Cardiovascular:     Rate and Rhythm: Normal rate and regular rhythm.     Pulses: Normal pulses.     Heart sounds: Normal heart sounds.  Pulmonary:     Effort: Pulmonary effort is normal.     Breath sounds:  Normal breath sounds.  Skin:    General: Skin is warm and dry.  Neurological:     General: No focal deficit present.     Mental Status: He is alert and oriented to person, place, and time.           Assessment & Plan:

## 2019-11-03 ENCOUNTER — Ambulatory Visit
Admission: RE | Admit: 2019-11-03 | Discharge: 2019-11-03 | Disposition: A | Discharge status: Home / Self Care | Payer: BC Managed Care – PPO | Source: Ambulatory Visit | Attending: Adult Health | Admitting: Adult Health

## 2019-11-03 ENCOUNTER — Other Ambulatory Visit: Payer: BC Managed Care – PPO

## 2019-11-03 DIAGNOSIS — D1723 Benign lipomatous neoplasm of skin and subcutaneous tissue of right leg: Secondary | ICD-10-CM

## 2019-11-07 ENCOUNTER — Telehealth: Payer: Self-pay | Admitting: Adult Health

## 2019-11-07 NOTE — Telephone Encounter (Signed)
The patient called wanting to know the results from his ultra sound on Thursday 11/03/2019.   I advised the patient that [name] was off on Friday and he doesn't work on Mondays so the results probably were not read yet.  He wants to know what does he need to do for the pain and discomfort?  Please advise

## 2019-11-08 ENCOUNTER — Other Ambulatory Visit: Payer: Self-pay | Admitting: Family Medicine

## 2019-11-08 DIAGNOSIS — D1779 Benign lipomatous neoplasm of other sites: Secondary | ICD-10-CM

## 2019-11-08 DIAGNOSIS — D1720 Benign lipomatous neoplasm of skin and subcutaneous tissue of unspecified limb: Secondary | ICD-10-CM

## 2019-11-08 NOTE — Telephone Encounter (Signed)
See result note.  Pt notified of results.

## 2019-11-08 NOTE — Telephone Encounter (Signed)
Pt called to check on results, he was expecting a call back yesterday from [facility]. Explained that Tommi Rumps was not in office yesterday and the results may not of been read just yet.    Pt is still having discomfort and will be going out of town in the next two hours, he would like a call back as soon as possible.

## 2019-11-14 ENCOUNTER — Telehealth: Payer: Self-pay | Admitting: Adult Health

## 2019-11-14 NOTE — Telephone Encounter (Signed)
Carlos Walker from [facility] advised that their office does not treat the area of knee that pt needed. Pt would like to have lipoma removed as soon as possible if we can get him referred somewhere else.

## 2019-11-15 ENCOUNTER — Other Ambulatory Visit: Payer: Self-pay | Admitting: Adult Health

## 2019-11-15 DIAGNOSIS — D1723 Benign lipomatous neoplasm of skin and subcutaneous tissue of right leg: Secondary | ICD-10-CM

## 2019-11-15 NOTE — Telephone Encounter (Signed)
Referred to [facility] in [place]

## 2019-11-17 DIAGNOSIS — M25561 Pain in right knee: Secondary | ICD-10-CM | POA: Diagnosis not present

## 2019-11-17 DIAGNOSIS — M25861 Other specified joint disorders, right knee: Secondary | ICD-10-CM | POA: Diagnosis not present

## 2019-11-30 DIAGNOSIS — M25561 Pain in right knee: Secondary | ICD-10-CM | POA: Diagnosis not present

## 2019-12-05 DIAGNOSIS — M25561 Pain in right knee: Secondary | ICD-10-CM | POA: Diagnosis not present

## 2019-12-11 IMAGING — DX CERVICAL SPINE - COMPLETE 4+ VIEW
5 series · 5 of 5 positions shown · non-contrast
Comparison: None.

CLINICAL DATA: Cervicalgia

EXAM:
CERVICAL SPINE - COMPLETE 4+ VIEW

[cervical spine lat]
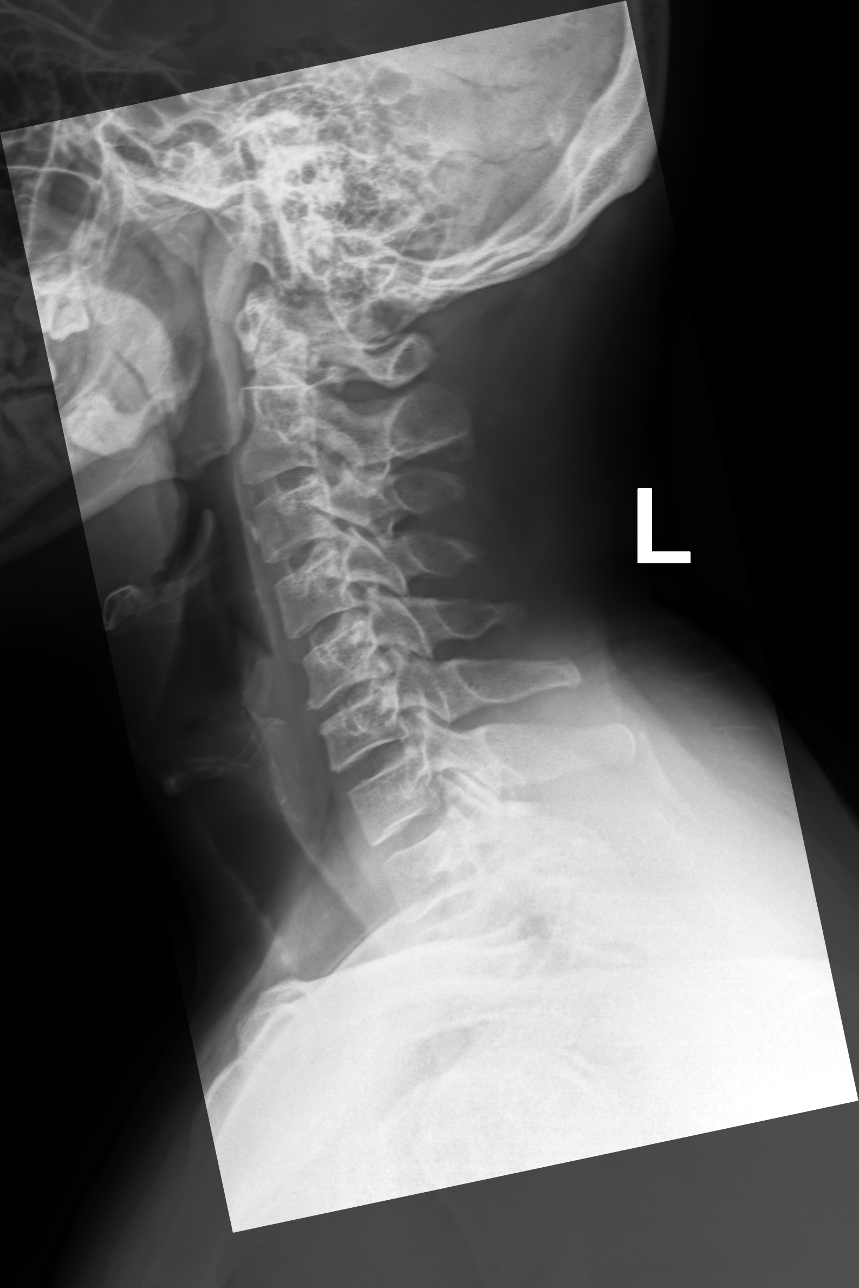

[cervical spine oblique (1 of 2)]
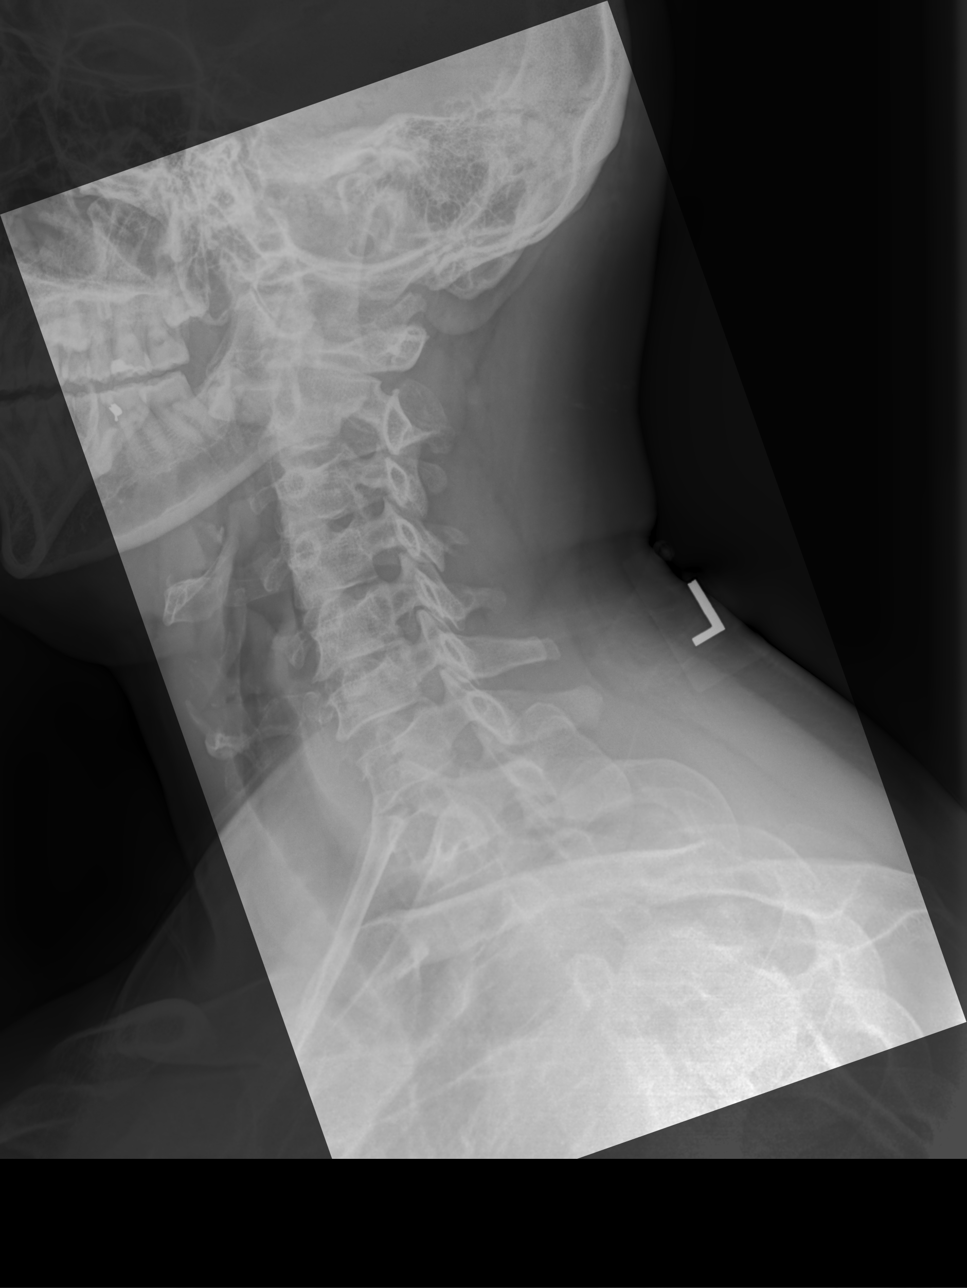

[cervical spine oblique (2 of 2)]
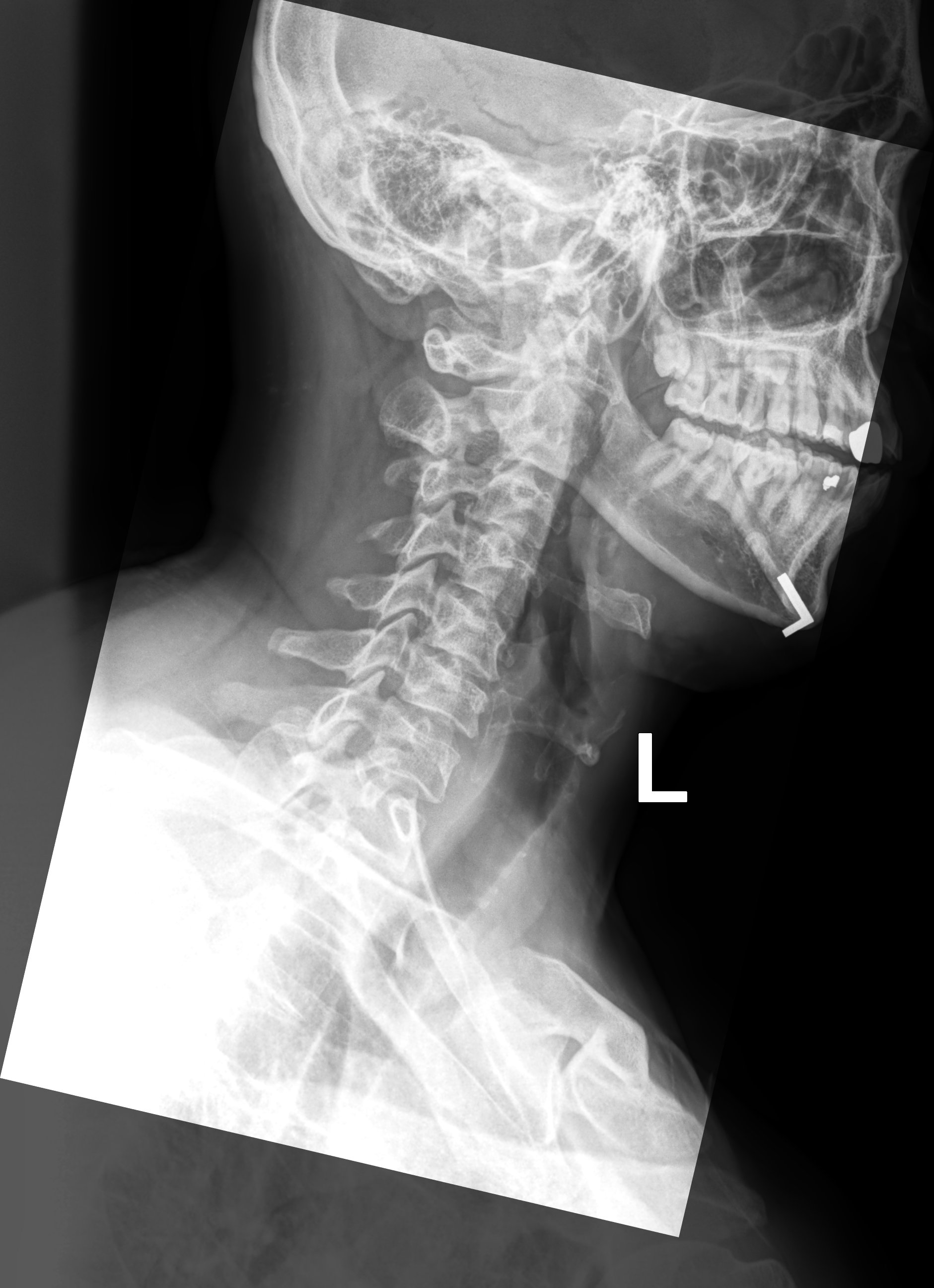

[cervical spine open mouth ap (1 of 2)]
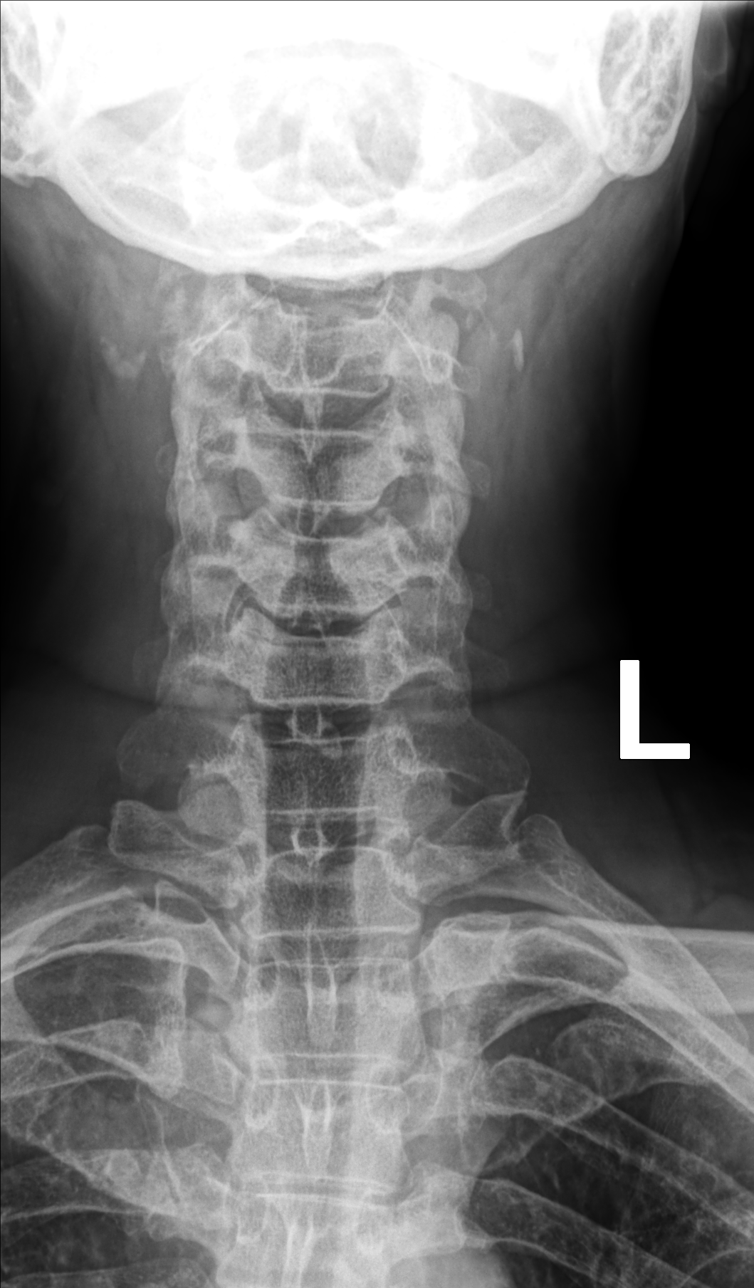

[cervical spine open mouth ap (2 of 2)]
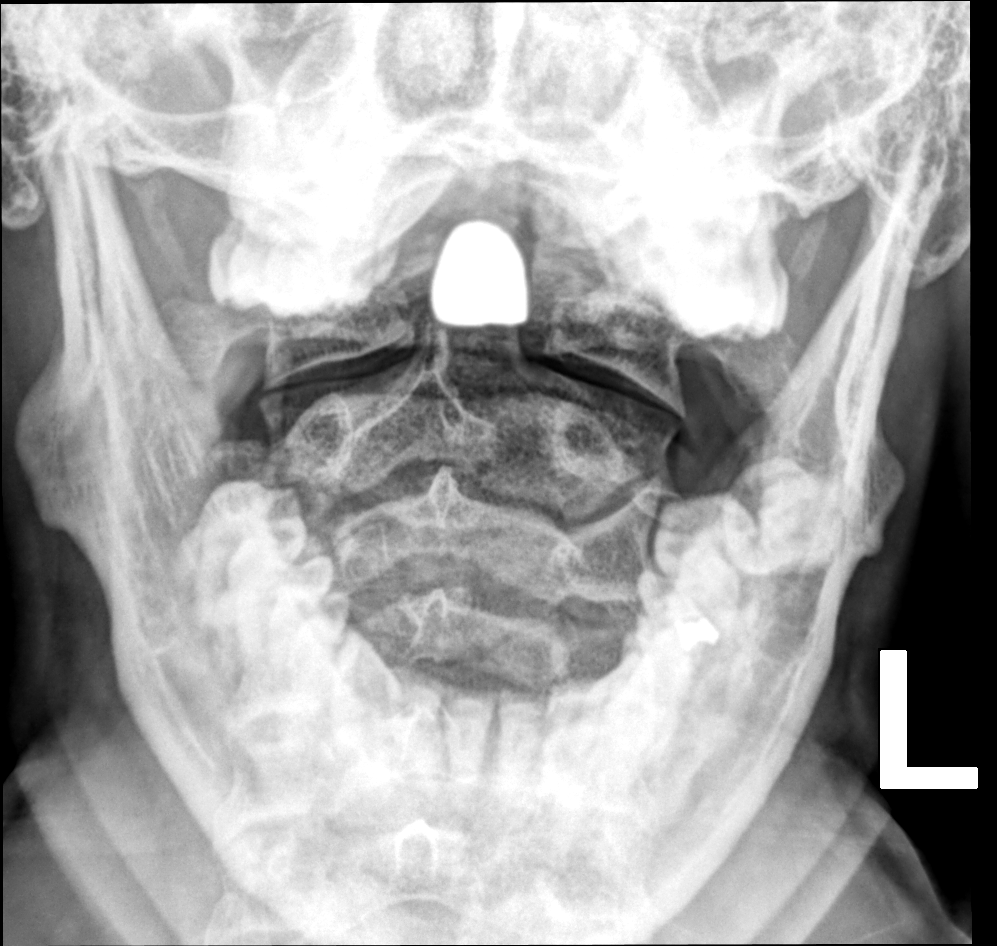

[5 of 5 positions shown; findings below may reference images not displayed]

FINDINGS: Frontal, lateral, open-mouth odontoid, and bilateral oblique views
were obtained. There is no demonstrable fracture or
spondylolisthesis. Prevertebral soft tissues and predental space
regions are. There is slight disc space narrowing at C5-6. Other
disc spaces appear unremarkable. There is facet hypertrophy with
exit foraminal narrowing at C5-6 bilaterally. No other significant
facet hypertrophy evident.

Lung apices are clear. There is calcification in each carotid
artery.
IMPRESSION: Localized osteoarthritic change at C5-6. No fracture or
spondylolisthesis.

There are foci of carotid artery calcification bilaterally.

## 2019-12-27 DIAGNOSIS — M25561 Pain in right knee: Secondary | ICD-10-CM | POA: Diagnosis not present

## 2020-01-10 DIAGNOSIS — M25561 Pain in right knee: Secondary | ICD-10-CM | POA: Diagnosis not present

## 2020-01-16 DIAGNOSIS — M7041 Prepatellar bursitis, right knee: Secondary | ICD-10-CM | POA: Diagnosis not present

## 2020-01-16 DIAGNOSIS — M67461 Ganglion, right knee: Secondary | ICD-10-CM | POA: Diagnosis not present

## 2020-01-16 HISTORY — PX: OTHER SURGICAL HISTORY: SHX169

## 2020-02-08 ENCOUNTER — Encounter: Payer: Self-pay | Admitting: Family Medicine

## 2020-03-20 IMAGING — DX DG CHEST 2V
2 series · 2 of 2 positions shown · non-contrast
Comparison: None.

CLINICAL DATA: Shortness of breath and cough for 2 days

EXAM:
CHEST - 2 VIEW

[chest lat]
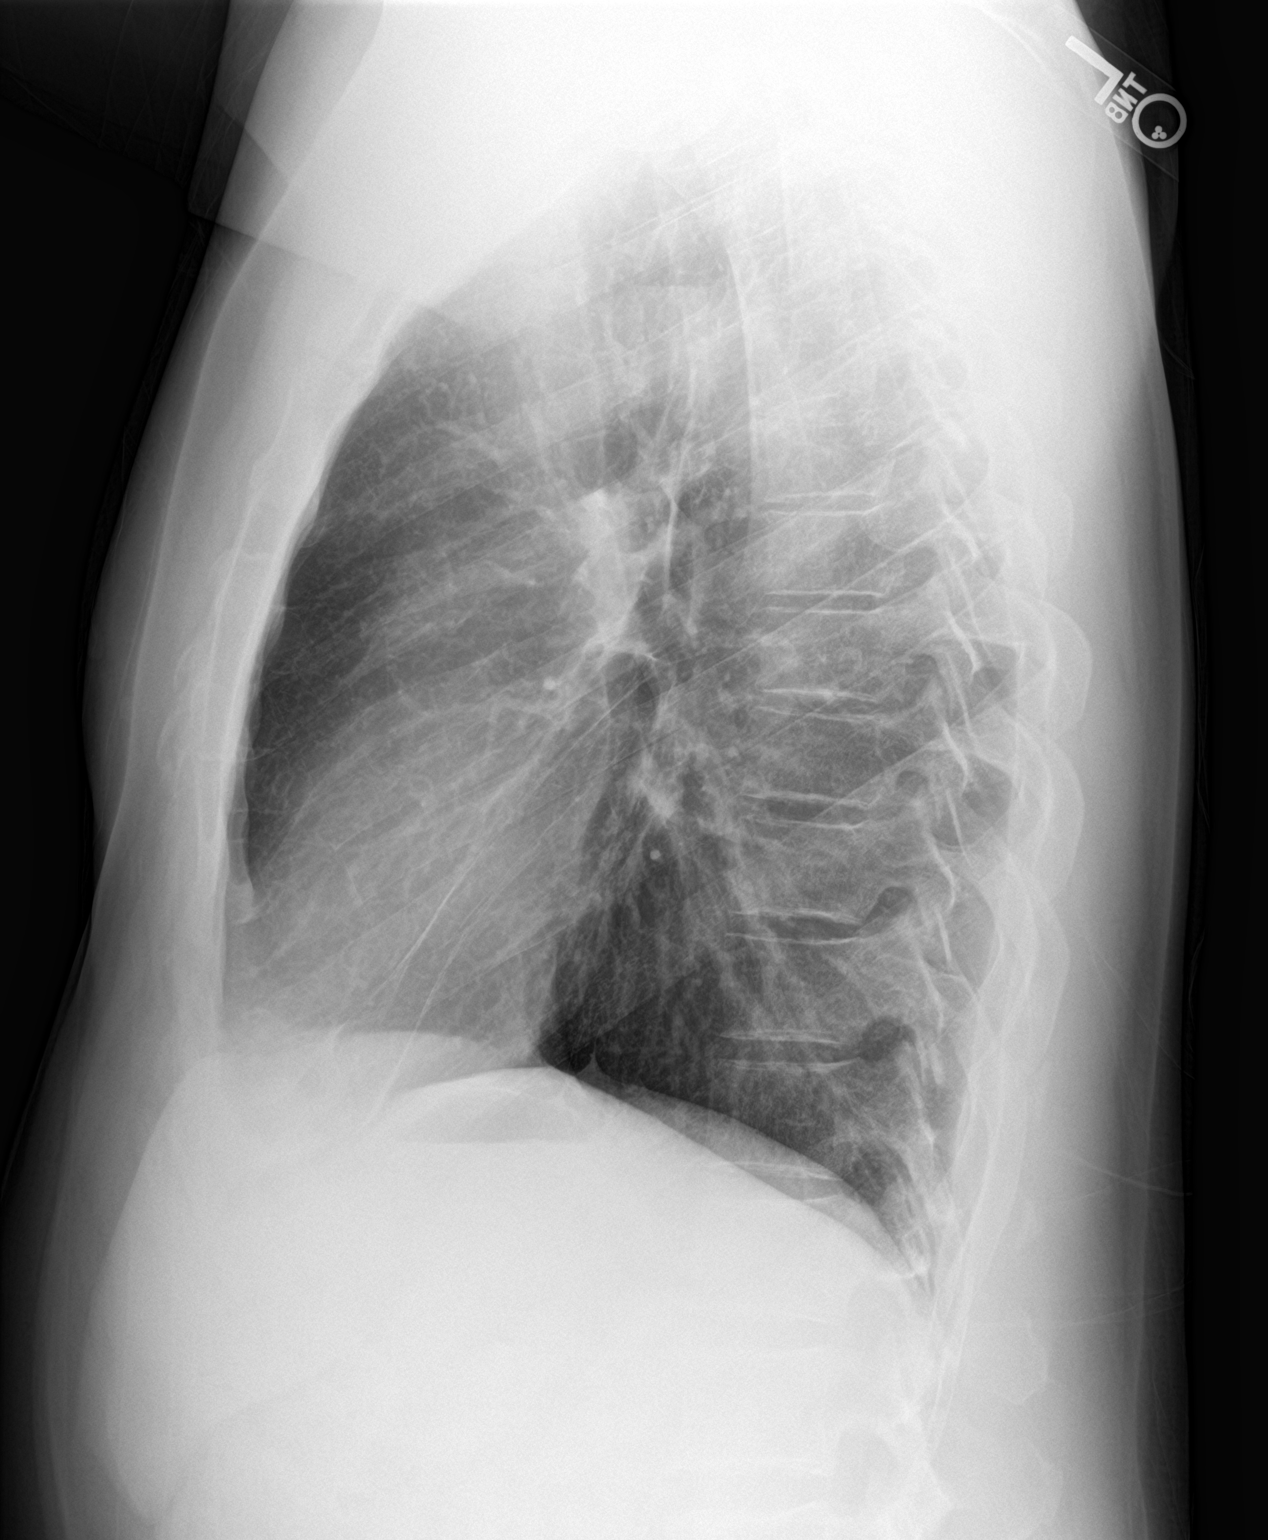

[chest pa]
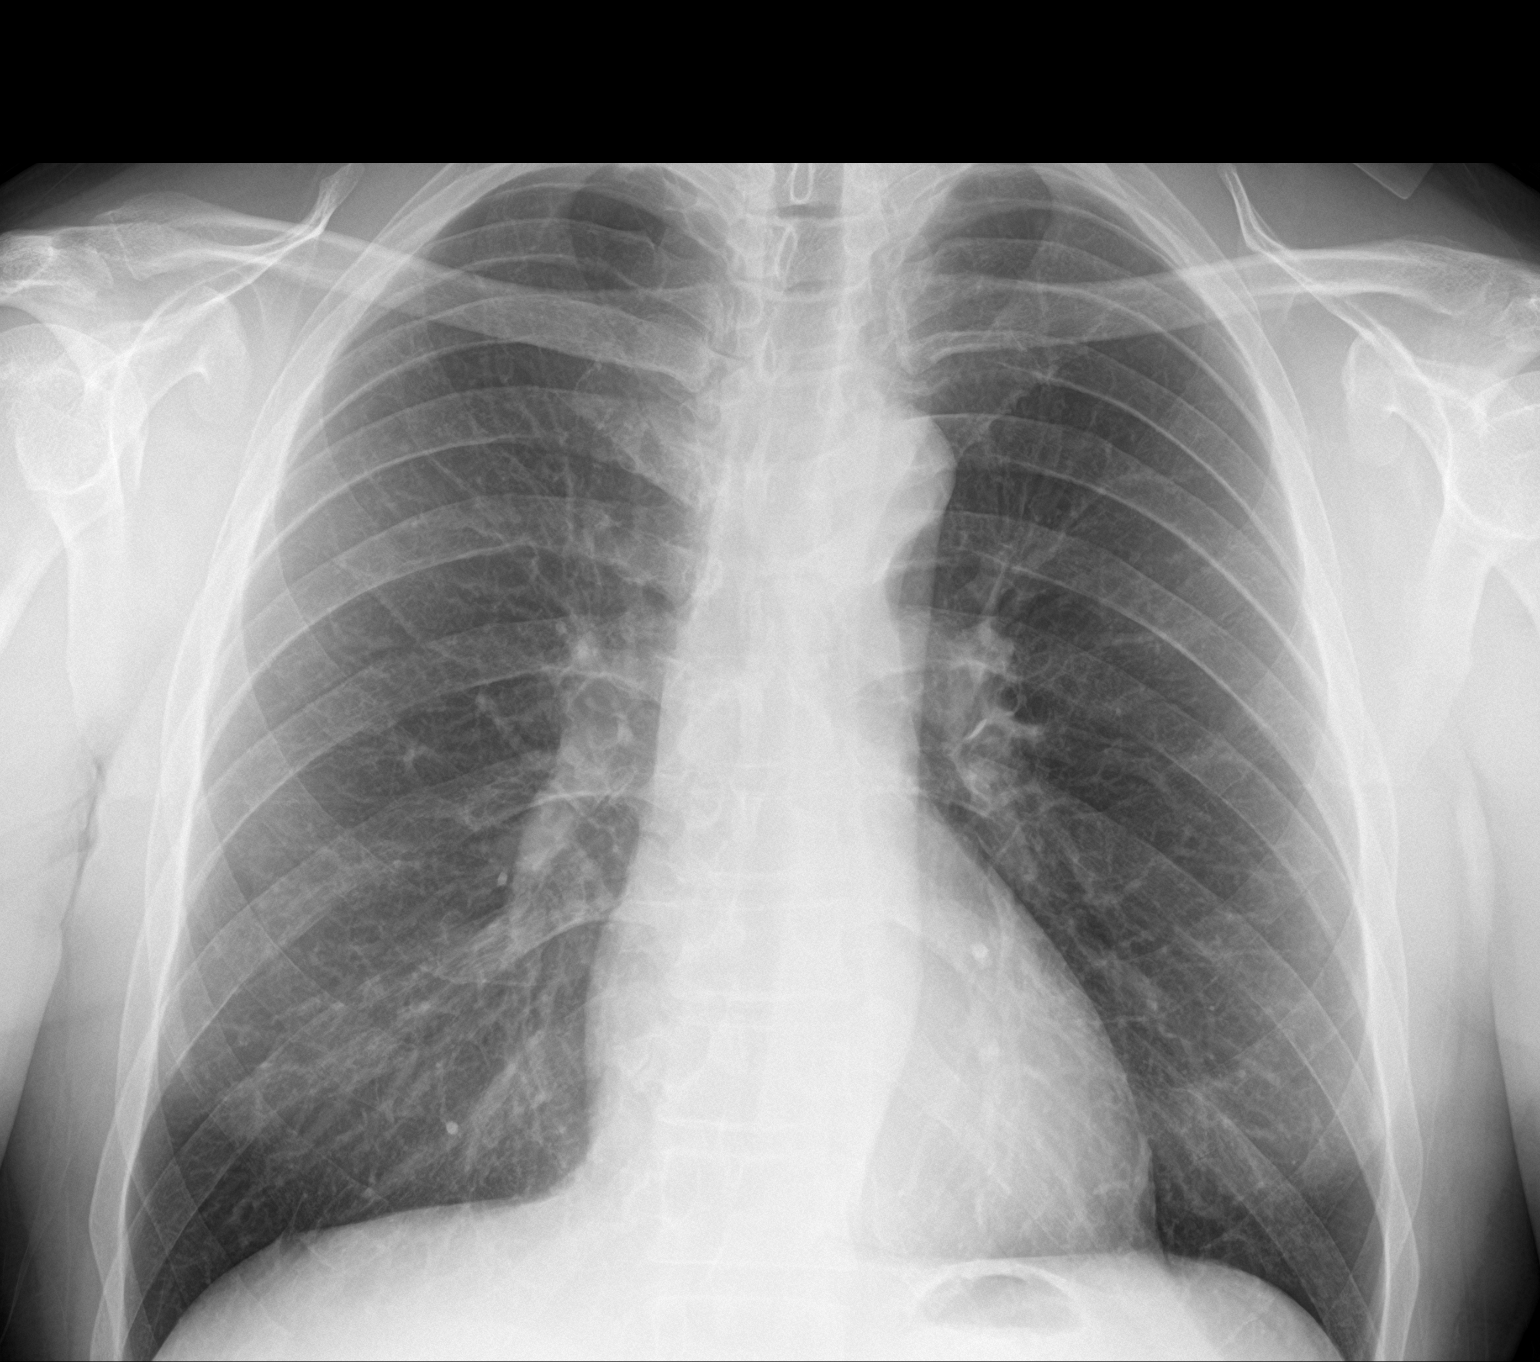

[2 of 2 positions shown; findings below may reference images not displayed]

FINDINGS: The heart size and mediastinal contours are within normal limits.
Both lungs are clear. The visualized skeletal structures are
unremarkable.
IMPRESSION: No active cardiopulmonary disease.

## 2020-04-10 ENCOUNTER — Encounter: Payer: 59 | Admitting: Adult Health

## 2020-04-10 ENCOUNTER — Encounter: Payer: Self-pay | Admitting: Adult Health

## 2020-04-24 ENCOUNTER — Other Ambulatory Visit: Payer: Self-pay | Admitting: Adult Health

## 2020-04-24 DIAGNOSIS — Z0000 Encounter for general adult medical examination without abnormal findings: Secondary | ICD-10-CM

## 2020-04-24 DIAGNOSIS — I10 Essential (primary) hypertension: Secondary | ICD-10-CM

## 2020-05-07 ENCOUNTER — Other Ambulatory Visit: Payer: Self-pay | Admitting: Adult Health

## 2020-05-07 DIAGNOSIS — R3914 Feeling of incomplete bladder emptying: Secondary | ICD-10-CM

## 2020-05-07 DIAGNOSIS — K219 Gastro-esophageal reflux disease without esophagitis: Secondary | ICD-10-CM

## 2020-05-07 DIAGNOSIS — N401 Enlarged prostate with lower urinary tract symptoms: Secondary | ICD-10-CM

## 2020-05-16 ENCOUNTER — Other Ambulatory Visit: Payer: Self-pay | Admitting: Adult Health

## 2020-05-16 DIAGNOSIS — I10 Essential (primary) hypertension: Secondary | ICD-10-CM

## 2020-05-16 DIAGNOSIS — Z0000 Encounter for general adult medical examination without abnormal findings: Secondary | ICD-10-CM

## 2020-05-16 NOTE — Telephone Encounter (Signed)
Pt was last seen on 04/21/2019.   Refill request for amlodipine.   Call to patient to schedule an appointment. Pt stated that he does not have insurance at the moment. I informed pt that he did not have to have a physical at this time, we could do a follow up that would not cost as much as a physical. Also informed that $80 would be needed when he checked in at the appointment.    Pt has scheduled for 10/26 at 9:30am. #30 sent to make sure he had enough to get to the appointment.

## 2020-05-29 ENCOUNTER — Other Ambulatory Visit: Payer: Self-pay | Admitting: Adult Health

## 2020-05-29 DIAGNOSIS — R3914 Feeling of incomplete bladder emptying: Secondary | ICD-10-CM

## 2020-05-29 DIAGNOSIS — N401 Enlarged prostate with lower urinary tract symptoms: Secondary | ICD-10-CM

## 2020-06-05 ENCOUNTER — Other Ambulatory Visit: Payer: Self-pay

## 2020-06-05 ENCOUNTER — Ambulatory Visit: Payer: Self-pay | Admitting: Adult Health

## 2020-06-05 ENCOUNTER — Encounter: Payer: Self-pay | Admitting: Adult Health

## 2020-06-05 VITALS — BP 118/78 | HR 78 | Temp 98.4°F | Ht 74.0 in | Wt 220.0 lb

## 2020-06-05 DIAGNOSIS — Z0000 Encounter for general adult medical examination without abnormal findings: Secondary | ICD-10-CM

## 2020-06-05 DIAGNOSIS — K219 Gastro-esophageal reflux disease without esophagitis: Secondary | ICD-10-CM

## 2020-06-05 DIAGNOSIS — N529 Male erectile dysfunction, unspecified: Secondary | ICD-10-CM

## 2020-06-05 DIAGNOSIS — I10 Essential (primary) hypertension: Secondary | ICD-10-CM

## 2020-06-05 DIAGNOSIS — R3914 Feeling of incomplete bladder emptying: Secondary | ICD-10-CM

## 2020-06-05 DIAGNOSIS — N401 Enlarged prostate with lower urinary tract symptoms: Secondary | ICD-10-CM

## 2020-06-05 DIAGNOSIS — E782 Mixed hyperlipidemia: Secondary | ICD-10-CM

## 2020-06-05 MED ORDER — OMEPRAZOLE 20 MG PO CPDR: 20.0000 mg | DELAYED_RELEASE_CAPSULE | Freq: Every day | ORAL | 3 refills | Status: DC

## 2020-06-05 MED ORDER — TADALAFIL 5 MG PO TABS: 5.0000 mg | ORAL_TABLET | Freq: Every day | ORAL | 3 refills | Status: DC | PRN

## 2020-06-05 MED ORDER — TAMSULOSIN HCL 0.4 MG PO CAPS: 0.4000 mg | ORAL_CAPSULE | Freq: Every day | ORAL | 3 refills | Status: DC

## 2020-06-05 MED ORDER — DOXYCYCLINE HYCLATE 100 MG PO CAPS: 100.0000 mg | ORAL_CAPSULE | Freq: Two times a day (BID) | ORAL | 0 refills | Status: AC

## 2020-06-05 MED ORDER — HYDROCORTISONE ACE-PRAMOXINE 1-1 % EX CREA: 1.0000 "application " | TOPICAL_CREAM | Freq: Two times a day (BID) | CUTANEOUS | 3 refills | Status: DC

## 2020-06-05 MED ORDER — AMLODIPINE BESYLATE 5 MG PO TABS: 5.0000 mg | ORAL_TABLET | Freq: Every day | ORAL | 3 refills | Status: DC

## 2020-06-05 NOTE — Progress Notes (Signed)
Subjective:    Patient ID: Carlos Walker, male    DOB: 1960/12/12, 59 y.o.   MRN: [id number]  HPI Patient presents for yearly preventative medicine examination. He is a pleasant 59 year old male who  has a past medical history of Arthritis, Hyperlipidemia, Hypertension, and Pain in lower back.  Essential Hypertension -takes norvasc 5 mg daily.   He denies chest pain, shortness of breath, dizziness, lightheadedness, or headaches. BP Readings from Last 3 Encounters:  06/05/20 118/78  10/26/19 116/76  05/13/19 124/75   BPH - Controlled with flomax 0.4 mg daily   ED -has trialed Viagra in the past which did not seem to work for him.  He was sent to urology who placed him on Cialis 5 mg daily and he had good results with this.  Also feels as though it helped with his BPH symptoms.  He does not have health insurance right now and is wondering if I can take over his prescription for Cialis 5 mg  Hyperlipidemia - Not currently on medication  Lab Results  Component Value Date   CHOL 244 (H) 04/06/2019   HDL 53.40 04/06/2019   LDLCALC 175 (H) 04/06/2019   LDLDIRECT 154.1 01/10/2013   TRIG 80.0 04/06/2019   CHOLHDL 5 04/06/2019    All immunizations and health maintenance protocols were reviewed with the patient and needed orders were placed.  Appropriate screening laboratory values were ordered for the patient including screening of hyperlipidemia, renal function and hepatic function. If indicated by BPH, a PSA was ordered.  Medication reconciliation,  past medical history, social history, problem list and allergies were reviewed in detail with the patient  Goals were established with regard to weight loss, exercise, and  diet in compliance with medications  Wt Readings from Last 3 Encounters:  06/05/20 220 lb (99.8 kg)  10/26/19 217 lb (98.4 kg)  04/21/19 211 lb (95.7 kg)   He is up to date on routine colon cancer screening - is due in May 2022  He has no acute complaints today      Review of Systems  Constitutional: Negative.   HENT: Negative.   Eyes: Negative.   Respiratory: Negative.   Cardiovascular: Negative.   Gastrointestinal: Negative.   Endocrine: Negative.   Genitourinary: Negative.   Musculoskeletal: Negative.   Skin: Negative.   Allergic/Immunologic: Negative.   Neurological: Negative.   Hematological: Negative.   Psychiatric/Behavioral: Negative.   All other systems reviewed and are negative.  Past Medical History:  Diagnosis Date  . Arthritis   . Hyperlipidemia   . Hypertension   . Pain in lower back    epidural for pain orthopedics    Social History   Socioeconomic History  . Marital status: Married    Spouse name: Not on file  . Number of children: Not on file  . Years of education: Not on file  . Highest education level: Not on file  Occupational History  . Not on file  Tobacco Use  . Smoking status: Former Smoker    Packs/day: 0.25    Years: 20.00    Pack years: 5.00    Types: Cigarettes    Quit date: 09/20/2016    Years since quitting: 3.7  . Smokeless tobacco: Never Used  . Tobacco comment: less than 1/2 pack per day  Vaping Use  . Vaping Use: Never used  Substance and Sexual Activity  . Alcohol use: No  . Drug use: No  . Sexual activity: Not on file  Other Topics Concern  . Not on file  Social History Narrative  . Not on file   Social Determinants of Health   Financial Resource Strain:   . Difficulty of Paying Living Expenses: Not on file  Food Insecurity:   . Worried About Charity fundraiser in the Last Year: Not on file  . Ran Out of Food in the Last Year: Not on file  Transportation Needs:   . Lack of Transportation (Medical): Not on file  . Lack of Transportation (Non-Medical): Not on file  Physical Activity:   . Days of Exercise per Week: Not on file  . Minutes of Exercise per Session: Not on file  Stress:   . Feeling of Stress : Not on file  Social Connections:   . Frequency of  Communication with Friends and Family: Not on file  . Frequency of Social Gatherings with Friends and Family: Not on file  . Attends Religious Services: Not on file  . Active Member of Clubs or Organizations: Not on file  . Attends Archivist Meetings: Not on file  . Marital Status: Not on file  Intimate Partner Violence:   . Fear of Current or Ex-Partner: Not on file  . Emotionally Abused: Not on file  . Physically Abused: Not on file  . Sexually Abused: Not on file    Past Surgical History:  Procedure Laterality Date  . CIRCUMCISION    . Right Knee Surgery Right 01/16/2020   Excision prepatellar ganglion  . right side repair     as child from football injury  . TONSILLECTOMY    . WISDOM TOOTH EXTRACTION      Family History  Problem Relation Age of Onset  . Diabetes Mother   . Heart disease Mother   . Hyperlipidemia Mother   . Stroke Mother   . Heart disease Father   . Heart attack Father   . Heart disease Brother   . Renal Disease Brother   . Hyperlipidemia Sister     No Known Allergies  Current Outpatient Medications on File Prior to Visit  Medication Sig Dispense Refill  . amLODipine (NORVASC) 5 MG tablet Take 1 tablet (5 mg total) by mouth daily. 30 tablet 0  . omeprazole (PRILOSEC) 20 MG capsule TAKE 1 CAPSULE BY MOUTH EVERY DAY 30 capsule 0  . pramoxine-hydrocortisone (ANALPRAM-HC) 1-1 % rectal cream Place 1 application rectally 2 (two) times daily. 30 g 1  . tadalafil (CIALIS) 5 MG tablet Take 5 mg by mouth daily.    . tamsulosin (FLOMAX) 0.4 MG CAPS capsule TAKE 1 CAPSULE BY MOUTH EVERY DAY 30 capsule 0   No current facility-administered medications on file prior to visit.    BP 118/78 (BP Location: Left Arm, Patient Position: Sitting, Cuff Size: Large)   Pulse 78   Temp 98.4 F (36.9 C) (Oral)   Ht _0  (1.88 m)   Wt 220 lb (99.8 kg)   SpO2 98%   BMI 28.25 kg/m       Objective:   Physical Exam Vitals and nursing note reviewed.   Constitutional:      General: He is not in acute distress.    Appearance: Normal appearance. He is well-developed and normal weight.  HENT:     Head: Normocephalic and atraumatic.     Right Ear: Tympanic membrane, ear canal and external ear normal. There is no impacted cerumen.     Left Ear: Tympanic membrane, ear canal and external ear normal.  There is no impacted cerumen.     Nose: Nose normal. No congestion or rhinorrhea.     Mouth/Throat:     Mouth: Mucous membranes are moist.     Pharynx: Oropharynx is clear. No oropharyngeal exudate or posterior oropharyngeal erythema.  Eyes:     General:        Right eye: No discharge.        Left eye: No discharge.     Extraocular Movements: Extraocular movements intact.     Conjunctiva/sclera: Conjunctivae normal.     Pupils: Pupils are equal, round, and reactive to light.  Neck:     Vascular: No carotid bruit.     Trachea: No tracheal deviation.  Cardiovascular:     Rate and Rhythm: Normal rate and regular rhythm.     Pulses: Normal pulses.     Heart sounds: Normal heart sounds. No murmur heard.  No friction rub. No gallop.   Pulmonary:     Effort: Pulmonary effort is normal. No respiratory distress.     Breath sounds: Normal breath sounds. No stridor. No wheezing, rhonchi or rales.  Chest:     Chest wall: No tenderness.  Abdominal:     General: Bowel sounds are normal. There is no distension.     Palpations: Abdomen is soft. There is no mass.     Tenderness: There is no abdominal tenderness. There is no right CVA tenderness, left CVA tenderness, guarding or rebound.     Hernia: No hernia is present.  Musculoskeletal:        General: No swelling, tenderness, deformity or signs of injury. Normal range of motion.     Right lower leg: No edema.     Left lower leg: No edema.  Lymphadenopathy:     Cervical: No cervical adenopathy.  Skin:    General: Skin is warm and dry.     Capillary Refill: Capillary refill takes less than 2  seconds.     Coloration: Skin is not jaundiced or pale.     Findings: No bruising, erythema, lesion or rash.  Neurological:     General: No focal deficit present.     Mental Status: He is alert and oriented to person, place, and time.     Cranial Nerves: No cranial nerve deficit.     Sensory: No sensory deficit.     Motor: No weakness.     Coordination: Coordination normal.     Gait: Gait normal.     Deep Tendon Reflexes: Reflexes normal.  Psychiatric:        Mood and Affect: Mood normal.        Behavior: Behavior normal.        Thought Content: Thought content normal.        Judgment: Judgment normal.       Assessment & Plan:  1. Routine general medical examination at a health care facility - Follow up in one year or sooner if needed  - CBC with Differential/Platelet; Future - Hemoglobin A1c; Future - Lipid panel; Future - TSH; Future - CMP with eGFR([facility]); Future - CMP with eGFR([facility]) - TSH - Lipid panel - Hemoglobin A1c - CBC with Differential/Platelet  2. Hypertension, essential - Well controlled. No change in medications  - amLODipine (NORVASC) 5 MG tablet; Take 1 tablet (5 mg total) by mouth daily.  Dispense: 90 tablet; Refill: 3 - CBC with Differential/Platelet; Future - Hemoglobin A1c; Future - Lipid panel; Future - TSH; Future - CMP with eGFR([facility]); Future -  CMP with eGFR([facility]) - TSH - Lipid panel - Hemoglobin A1c - CBC with Differential/Platelet  3. Gastroesophageal reflux disease without esophagitis  - omeprazole (PRILOSEC) 20 MG capsule; Take 1 capsule (20 mg total) by mouth daily.  Dispense: 90 capsule; Refill: 3 - CBC with Differential/Platelet; Future - Hemoglobin A1c; Future - Lipid panel; Future - TSH; Future - CMP with eGFR([facility]); Future - CMP with eGFR([facility]) - TSH - Lipid panel - Hemoglobin A1c - CBC with Differential/Platelet  4. Benign prostatic hyperplasia with incomplete bladder emptying  - tamsulosin (FLOMAX) 0.4 MG CAPS  capsule; Take 1 capsule (0.4 mg total) by mouth daily.  Dispense: 90 capsule; Refill: 3 - tadalafil (CIALIS) 5 MG tablet; Take 1 tablet (5 mg total) by mouth daily as needed for erectile dysfunction.  Dispense: 90 tablet; Refill: 3 - CBC with Differential/Platelet; Future - Hemoglobin A1c; Future - Lipid panel; Future - TSH; Future - PSA; Future - CMP with eGFR([facility]); Future - CMP with eGFR([facility]) - PSA - TSH - Lipid panel - Hemoglobin A1c - CBC with Differential/Platelet  5. Erectile dysfunction, unspecified erectile dysfunction type  - tamsulosin (FLOMAX) 0.4 MG CAPS capsule; Take 1 capsule (0.4 mg total) by mouth daily.  Dispense: 90 capsule; Refill: 3  6. Mixed hyperlipidemia - Consider statin  - CBC with Differential/Platelet; Future - Hemoglobin A1c; Future - Lipid panel; Future - TSH; Future  [employer]

## 2020-06-06 ENCOUNTER — Encounter: Payer: Self-pay | Admitting: Adult Health

## 2020-06-06 LAB — CBC WITH DIFFERENTIAL/PLATELET
Absolute Monocytes: 353 cells/uL (ref 200–950)
Basophils Absolute: 19 cells/uL (ref 0–200)
Basophils Relative: 0.5 %
Eosinophils Absolute: 61 cells/uL (ref 15–500)
Eosinophils Relative: 1.6 %
HCT: 44.9 % (ref 38.5–50.0)
Hemoglobin: 14.9 g/dL (ref 13.2–17.1)
Lymphs Abs: 1550 cells/uL (ref 850–3900)
MCH: 28.7 pg (ref 27.0–33.0)
MCHC: 33.2 g/dL (ref 32.0–36.0)
MCV: 86.5 fL (ref 80.0–100.0)
MPV: 12 fL (ref 7.5–12.5)
Monocytes Relative: 9.3 %
Neutro Abs: 1816 cells/uL (ref 1500–7800)
Neutrophils Relative %: 47.8 %
Platelets: 205 10*3/uL (ref 140–400)
RBC: 5.19 10*6/uL (ref 4.20–5.80)
RDW: 13.3 % (ref 11.0–15.0)
Total Lymphocyte: 40.8 %
WBC: 3.8 10*3/uL (ref 3.8–10.8)

## 2020-06-06 LAB — COMPLETE METABOLIC PANEL WITH GFR
AG Ratio: 1.7 (calc) (ref 1.0–2.5)
ALT: 16 U/L (ref 9–46)
AST: 14 U/L (ref 10–35)
Albumin: 4.6 g/dL (ref 3.6–5.1)
Alkaline phosphatase (APISO): 62 U/L (ref 35–144)
BUN: 15 mg/dL (ref 7–25)
CO2: 25 mmol/L (ref 20–32)
Calcium: 9.6 mg/dL (ref 8.6–10.3)
Chloride: 105 mmol/L (ref 98–110)
Creat: 1.13 mg/dL (ref 0.70–1.33)
GFR, Est African American: 82 mL/min/{1.73_m2} (ref >=60)
GFR, Est Non African American: 71 mL/min/{1.73_m2} (ref >=60)
Globulin: 2.7 g/dL (calc) (ref 1.9–3.7)
Glucose, Bld: 96 mg/dL (ref 65–99)
Potassium: 4.3 mmol/L (ref 3.5–5.3)
Sodium: 138 mmol/L (ref 135–146)
Total Bilirubin: 0.5 mg/dL (ref 0.2–1.2)
Total Protein: 7.3 g/dL (ref 6.1–8.1)

## 2020-06-06 LAB — LIPID PANEL
Cholesterol: 260 mg/dL — ABNORMAL HIGH (ref <200)
HDL: 55 mg/dL (ref >=40)
LDL Cholesterol (Calc): 184 mg/dL (calc) — ABNORMAL HIGH
Non-HDL Cholesterol (Calc): 205 mg/dL (calc) — ABNORMAL HIGH (ref <130)
Total CHOL/HDL Ratio: 4.7 (calc) (ref <5.0)
Triglycerides: 96 mg/dL (ref <150)

## 2020-06-06 LAB — HEMOGLOBIN A1C
Hgb A1c MFr Bld: 6.2 % of total Hgb — ABNORMAL HIGH (ref <5.7)
Mean Plasma Glucose: 131 (calc)
eAG (mmol/L): 7.3 (calc)

## 2020-06-06 LAB — PSA: PSA: 1.45 ng/mL (ref <=4.0)

## 2020-06-06 LAB — TSH: TSH: 2.63 mIU/L (ref 0.40–4.50)

## 2020-06-06 MED ORDER — ROSUVASTATIN CALCIUM 20 MG PO TABS: 20.0000 mg | ORAL_TABLET | Freq: Every day | ORAL | 1 refills | Status: DC

## 2020-06-06 NOTE — Addendum Note (Signed)
Addended by: Agnes Lawrence on: 06/06/2020 09:16 AM   Modules accepted: Orders

## 2020-06-10 ENCOUNTER — Other Ambulatory Visit: Payer: Self-pay | Admitting: Adult Health

## 2020-06-10 DIAGNOSIS — I10 Essential (primary) hypertension: Secondary | ICD-10-CM

## 2020-06-26 ENCOUNTER — Other Ambulatory Visit: Payer: Self-pay | Admitting: Adult Health

## 2020-06-26 DIAGNOSIS — N529 Male erectile dysfunction, unspecified: Secondary | ICD-10-CM

## 2020-06-26 DIAGNOSIS — N401 Enlarged prostate with lower urinary tract symptoms: Secondary | ICD-10-CM

## 2020-09-10 IMAGING — US US EXTREM LOW*R* LIMITED
1 series · 14 of 23 positions shown · non-contrast
Comparison: None.

CLINICAL DATA: Lump on right patella x1 month.

EXAM:
ULTRASOUND RIGHT LOWER EXTREMITY LIMITED
TECHNIQUE: Ultrasound examination of the lower extremity soft tissues was
performed in the area of clinical concern.

[Series 1: us extrem low*right* limited · 0.05mm/px · 14 of 23 slices shown]
[im 1/23]
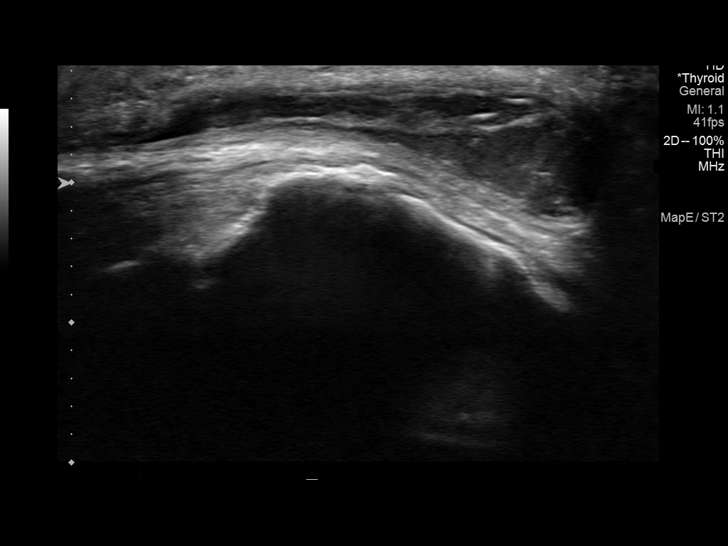
[im 3/23]
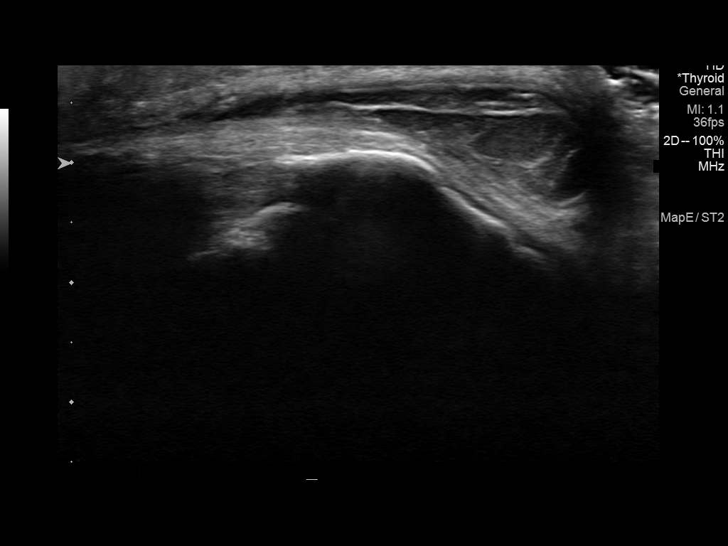
[im 5/23]
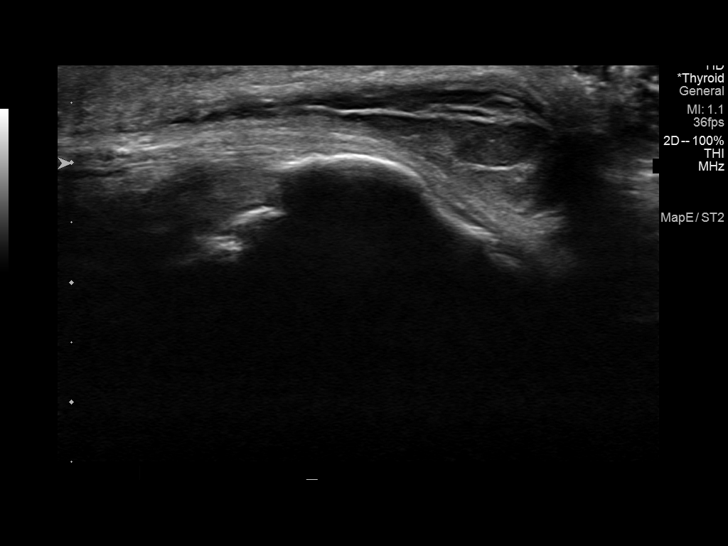
[im 6/23]
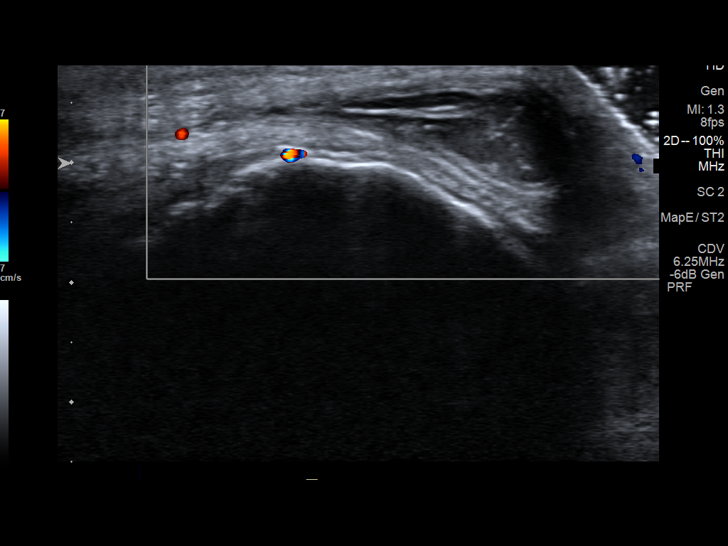
[im 8/23]
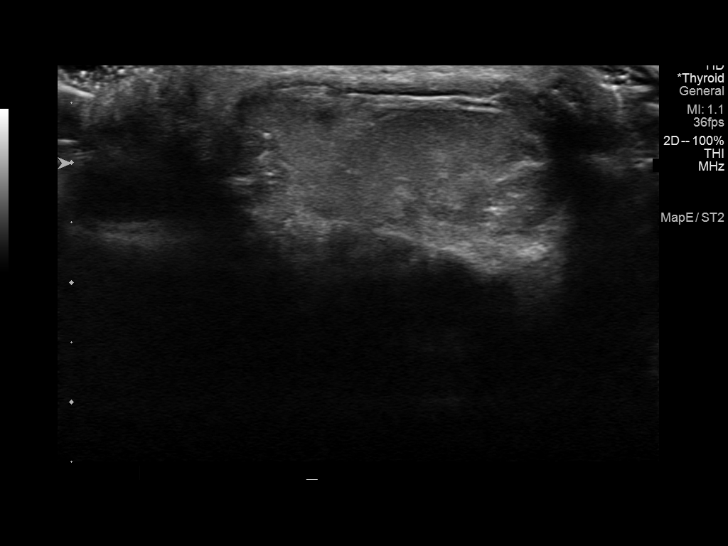
[im 10/23]
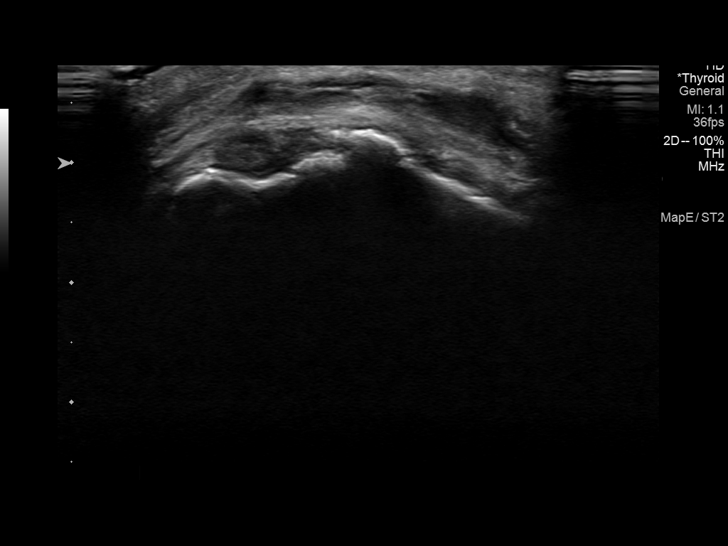
[im 11/23]
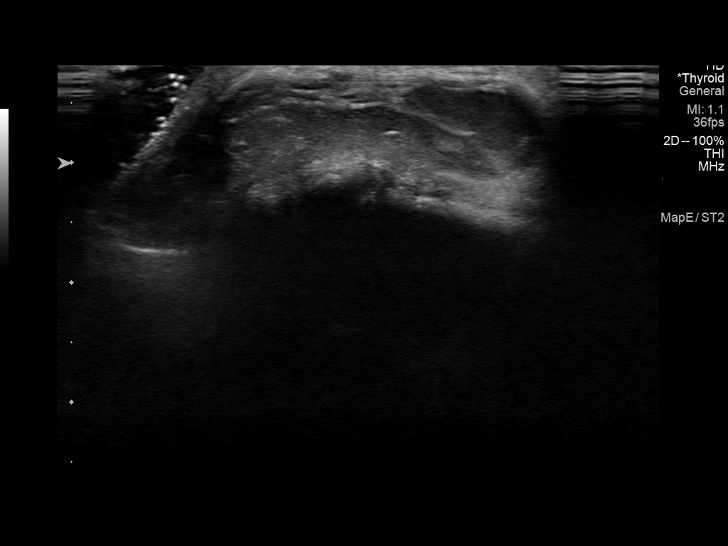
[im 13/23]
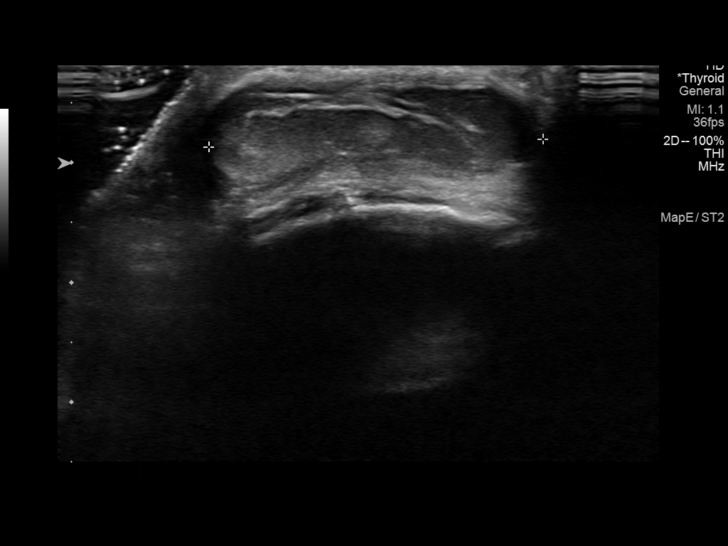
[im 14/23]
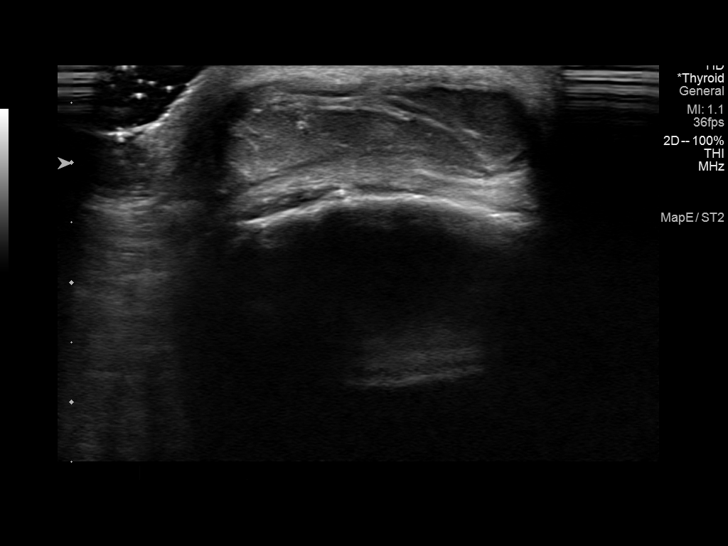
[im 16/23]
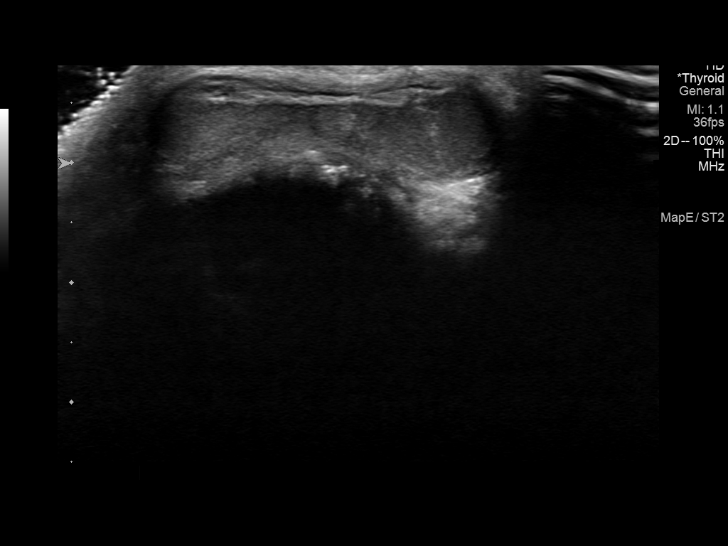
[im 18/23]
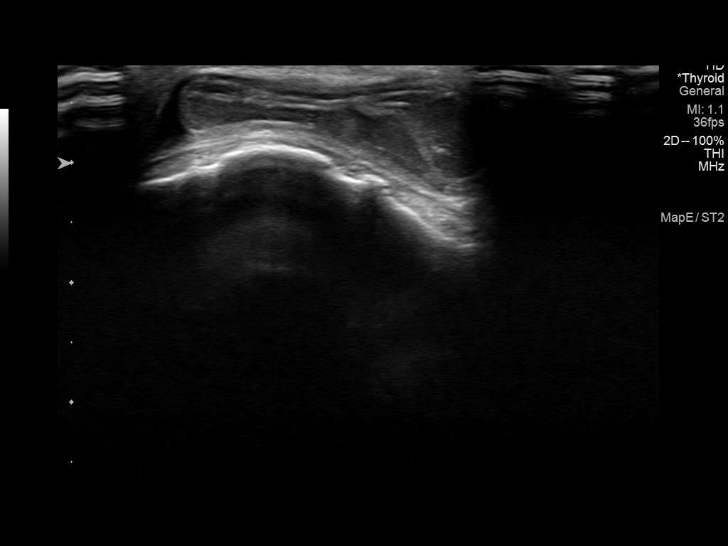
[im 19/23]
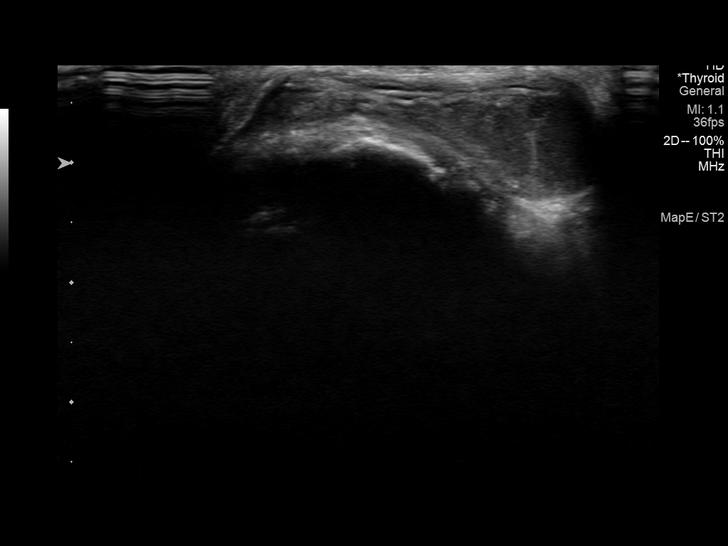
[im 21/23]
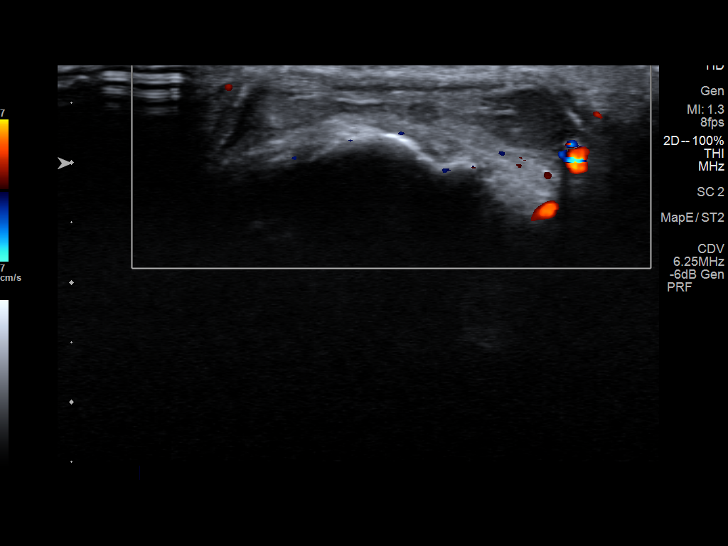
[im 23/23]
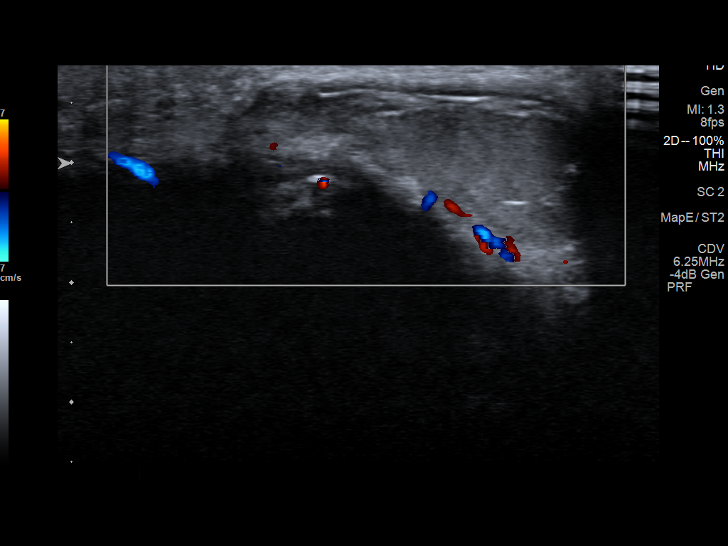

[14 of 23 positions shown; findings below may reference images not displayed]

FINDINGS: Joint Space: No effusion.

Muscles: Normal.

Tendons: Normal

Other Soft Tissue Structures: A 3.5 cm x 1.0 cm x 3.0 cm area of
complex heterogeneous hypoechogenicity is seen within the soft
tissues along the anterior aspect of the patella on the right. No
abnormal flow is seen within this region on color Doppler
evaluation.
IMPRESSION: Complex solid and cystic appearing soft tissue mass within the soft
tissues anterior to the patella on the right.

## 2020-12-05 ENCOUNTER — Other Ambulatory Visit: Payer: Self-pay | Admitting: Adult Health

## 2021-04-23 ENCOUNTER — Encounter: Payer: Self-pay | Admitting: Internal Medicine

## 2021-05-01 ENCOUNTER — Telehealth: Payer: Self-pay | Admitting: Adult Health

## 2021-05-01 ENCOUNTER — Other Ambulatory Visit: Payer: Self-pay | Admitting: Adult Health

## 2021-05-01 MED ORDER — HYDROCORTISONE ACE-PRAMOXINE 1-1 % EX CREA: 1.0000 "application " | TOPICAL_CREAM | Freq: Two times a day (BID) | CUTANEOUS | 2 refills | Status: DC

## 2021-05-01 NOTE — Telephone Encounter (Signed)
Pt requesting refill of pramoxine-hydrocortisone (PROCTOCREAM-HC) 1-1 % rectal cream to be refilled to [facility] on file.   Also noted that last OV 06/05/20. Pt states he has new insurance. Annual appt scheduled.  Pt has questions if he can take beet root powder with his current antihypertensive medication.

## 2021-05-01 NOTE — Telephone Encounter (Signed)
Patient called because the prescription for pramoxine-hydrocortisone (PROCTOCREAM-HC) 1-1 % rectal cream is out of stock at the [facility] and he needs it sent to the [facility] at [address], [place] [address]. Patient also wanted to ask about his blood pressure medication and what he can take with it, would like a call back.    Good callback number is [phone]       Please Advise

## 2021-05-01 NOTE — Telephone Encounter (Signed)
PT called to advise that he would like to talk to [facility] or his nurse in regards to his medications. Please advise he has questions about a cream he has taken as well as substituting his bp meds for beetroot powder/pill

## 2021-05-01 NOTE — Addendum Note (Signed)
Addended by: Elza Rafter D on: 05/01/2021 11:56 AM   Modules accepted: Orders

## 2021-05-01 NOTE — Telephone Encounter (Signed)
Cream sent in.   He can take beet root powder with his blood pressure medications but there is no convincing evidence it does anything and it is not a substitute for his blood pressure medications

## 2021-05-16 ENCOUNTER — Telehealth (INDEPENDENT_AMBULATORY_CARE_PROVIDER_SITE_OTHER): Payer: 59 | Admitting: Adult Health

## 2021-05-16 ENCOUNTER — Encounter: Payer: Self-pay | Admitting: Adult Health

## 2021-05-16 VITALS — Ht 74.0 in | Wt 220.0 lb

## 2021-05-16 DIAGNOSIS — J329 Chronic sinusitis, unspecified: Secondary | ICD-10-CM | POA: Diagnosis not present

## 2021-05-16 DIAGNOSIS — B9789 Other viral agents as the cause of diseases classified elsewhere: Secondary | ICD-10-CM

## 2021-05-16 NOTE — Progress Notes (Signed)
Virtual Visit via Video Note  I connected with Carlos Walker on 05/16/21 at  4:00 PM EDT by a video enabled telemedicine application and verified that I am speaking with the correct person using two identifiers.  Location patient: home Location provider:work or home office Persons participating in the virtual visit: patient, provider  I discussed the limitations of evaluation and management by telemedicine and the availability of in person appointments. The patient expressed understanding and agreed to proceed.   HPI: Is a 60 year old male who is being evaluated today for an acute issue.  His symptoms started roughly 4 days ago.  Symptoms include nasal congestion with clear rhinorrhea, sinus pain and pressure, generalized body aches, fatigue, and chills.  Denies fevers, shortness of breath, wheezing.  His wife is sick with the same symptoms.  He took a COVID test about an hour ago which was negative.  Is been using over-the-counter sinus congestion medication and TheraFlu with minimal improvement   ROS: See pertinent positives and negatives per HPI.  Past Medical History:  Diagnosis Date   Arthritis    Hyperlipidemia    Hypertension    Pain in lower back    epidural for pain orthopedics    Past Surgical History:  Procedure Laterality Date   CIRCUMCISION     Right Knee Surgery Right 01/16/2020   Excision prepatellar ganglion   right side repair     as child from football injury   TONSILLECTOMY     WISDOM TOOTH EXTRACTION      Family History  Problem Relation Age of Onset   Diabetes Mother    Heart disease Mother    Hyperlipidemia Mother    Stroke Mother    Heart disease Father    Heart attack Father    Heart disease Brother    Renal Disease Brother    Hyperlipidemia Sister        Current Outpatient Medications:    amLODipine (NORVASC) 5 MG tablet, TAKE 1 TABLET BY MOUTH EVERY DAY, Disp: 30 tablet, Rfl: 5   omeprazole (PRILOSEC) 20 MG capsule, Take 1 capsule (20  mg total) by mouth daily., Disp: 90 capsule, Rfl: 3   pramoxine-hydrocortisone (PROCTOCREAM-HC) 1-1 % rectal cream, Place 1 application rectally 2 (two) times daily., Disp: 30 g, Rfl: 2   rosuvastatin (CRESTOR) 20 MG tablet, TAKE ONE TABLET BY MOUTH DAILY, Disp: 90 tablet, Rfl: 1   tadalafil (CIALIS) 5 MG tablet, Take 5 mg by mouth daily., Disp: , Rfl:    tadalafil (CIALIS) 5 MG tablet, Take 1 tablet (5 mg total) by mouth daily as needed for erectile dysfunction., Disp: 90 tablet, Rfl: 3   tamsulosin (FLOMAX) 0.4 MG CAPS capsule, TAKE 1 CAPSULE BY MOUTH EVERY DAY, Disp: 30 capsule, Rfl: 5  EXAM:  VITALS per patient if applicable:  GENERAL: alert, oriented, appears well and in no acute distress  HEENT: atraumatic, conjunttiva clear, no obvious abnormalities on inspection of external nose and ears  NECK: normal movements of the head and neck  LUNGS: on inspection no signs of respiratory distress, breathing rate appears normal, no obvious gross SOB, gasping or wheezing  CV: no obvious cyanosis  MS: moves all visible extremities without noticeable abnormality  PSYCH/NEURO: pleasant and cooperative, no obvious depression or anxiety, speech and thought processing grossly intact  ASSESSMENT AND PLAN:  Discussed the following assessment and plan:  1. Viral sinusitis -Due to duration, likely viral.  Reviewed natural course of viral infections with the patient.  Advise over-the-counter medication,  rest, and hydration.  If no improvement over the next 3 to 4 days or he develops a fever then follow-up.      I discussed the assessment and treatment plan with the patient. The patient was provided an opportunity to ask questions and all were answered. The patient agreed with the plan and demonstrated an understanding of the instructions.   The patient was advised to call back or seek an in-person evaluation if the symptoms worsen or if the condition fails to improve as anticipated.   [name], NP

## 2021-05-17 ENCOUNTER — Encounter: Payer: Self-pay | Admitting: Adult Health

## 2021-05-17 ENCOUNTER — Telehealth: Payer: Self-pay

## 2021-05-17 NOTE — Telephone Encounter (Signed)
Per Tommi Rumps okay for pt to return on work 05/18/2021. Letter sent to [place]. Pt notified of update.

## 2021-05-17 NOTE — Telephone Encounter (Signed)
Patient called again to follow up on a note to go back to work tomorrow instead of Monday. Patient would like note put on mychart so he can access it. I let patient know that previous note was sent in.  Please Advise

## 2021-05-17 NOTE — Telephone Encounter (Signed)
Patient called requesting a call back to discuss going back to work tomorrow instead of Monday.

## 2021-06-05 ENCOUNTER — Other Ambulatory Visit: Payer: Self-pay

## 2021-06-05 ENCOUNTER — Other Ambulatory Visit: Payer: Self-pay | Admitting: Adult Health

## 2021-06-05 DIAGNOSIS — K219 Gastro-esophageal reflux disease without esophagitis: Secondary | ICD-10-CM

## 2021-06-06 ENCOUNTER — Encounter: Payer: Self-pay | Admitting: Adult Health

## 2021-06-06 ENCOUNTER — Ambulatory Visit (INDEPENDENT_AMBULATORY_CARE_PROVIDER_SITE_OTHER): Payer: 59 | Admitting: Adult Health

## 2021-06-06 VITALS — BP 128/82 | HR 68 | Temp 97.8°F | Ht 73.0 in | Wt 223.0 lb

## 2021-06-06 DIAGNOSIS — N401 Enlarged prostate with lower urinary tract symptoms: Secondary | ICD-10-CM

## 2021-06-06 DIAGNOSIS — N529 Male erectile dysfunction, unspecified: Secondary | ICD-10-CM | POA: Diagnosis not present

## 2021-06-06 DIAGNOSIS — I10 Essential (primary) hypertension: Secondary | ICD-10-CM | POA: Diagnosis not present

## 2021-06-06 DIAGNOSIS — Z0000 Encounter for general adult medical examination without abnormal findings: Secondary | ICD-10-CM

## 2021-06-06 DIAGNOSIS — Z1211 Encounter for screening for malignant neoplasm of colon: Secondary | ICD-10-CM

## 2021-06-06 DIAGNOSIS — E782 Mixed hyperlipidemia: Secondary | ICD-10-CM

## 2021-06-06 DIAGNOSIS — R3914 Feeling of incomplete bladder emptying: Secondary | ICD-10-CM | POA: Diagnosis not present

## 2021-06-06 DIAGNOSIS — K219 Gastro-esophageal reflux disease without esophagitis: Secondary | ICD-10-CM

## 2021-06-06 LAB — COMPREHENSIVE METABOLIC PANEL
ALT: 16 U/L (ref 0–53)
AST: 15 U/L (ref 0–37)
Albumin: 4.5 g/dL (ref 3.5–5.2)
Alkaline Phosphatase: 57 U/L (ref 39–117)
BUN: 15 mg/dL (ref 6–23)
CO2: 31 mEq/L (ref 19–32)
Calcium: 9.3 mg/dL (ref 8.4–10.5)
Chloride: 103 mEq/L (ref 96–112)
Creatinine, Ser: 1.25 mg/dL (ref 0.40–1.50)
GFR: 62.42 mL/min (ref >60.00)
Glucose, Bld: 89 mg/dL (ref 70–99)
Potassium: 4.1 mEq/L (ref 3.5–5.1)
Sodium: 139 mEq/L (ref 135–145)
Total Bilirubin: 0.6 mg/dL (ref 0.2–1.2)
Total Protein: 7.1 g/dL (ref 6.0–8.3)

## 2021-06-06 LAB — LIPID PANEL
Cholesterol: 133 mg/dL (ref 0–200)
HDL: 54 mg/dL (ref >39.00)
LDL Cholesterol: 70 mg/dL (ref 0–99)
NonHDL: 78.94
Total CHOL/HDL Ratio: 2
Triglycerides: 43 mg/dL (ref 0.0–149.0)
VLDL: 8.6 mg/dL (ref 0.0–40.0)

## 2021-06-06 LAB — CBC WITH DIFFERENTIAL/PLATELET
Basophils Absolute: 0 10*3/uL (ref 0.0–0.1)
Basophils Relative: 0.3 % (ref 0.0–3.0)
Eosinophils Absolute: 0 10*3/uL (ref 0.0–0.7)
Eosinophils Relative: 1.4 % (ref 0.0–5.0)
HCT: 42.9 % (ref 39.0–52.0)
Hemoglobin: 13.7 g/dL (ref 13.0–17.0)
Lymphocytes Relative: 39.8 % (ref 12.0–46.0)
Lymphs Abs: 1.3 10*3/uL (ref 0.7–4.0)
MCHC: 32 g/dL (ref 30.0–36.0)
MCV: 89.5 fl (ref 78.0–100.0)
Monocytes Absolute: 0.4 10*3/uL (ref 0.1–1.0)
Monocytes Relative: 12.7 % — ABNORMAL HIGH (ref 3.0–12.0)
Neutro Abs: 1.5 10*3/uL (ref 1.4–7.7)
Neutrophils Relative %: 45.8 % (ref 43.0–77.0)
Platelets: 167 10*3/uL (ref 150.0–400.0)
RBC: 4.8 Mil/uL (ref 4.22–5.81)
RDW: 14.1 % (ref 11.5–15.5)
WBC: 3.3 10*3/uL — ABNORMAL LOW (ref 4.0–10.5)

## 2021-06-06 LAB — TSH: TSH: 1.53 u[IU]/mL (ref 0.35–5.50)

## 2021-06-06 LAB — HEMOGLOBIN A1C: Hgb A1c MFr Bld: 6.5 % (ref 4.6–6.5)

## 2021-06-06 LAB — PSA: PSA: 1.46 ng/mL (ref 0.10–4.00)

## 2021-06-06 MED ORDER — ROSUVASTATIN CALCIUM 20 MG PO TABS: 20.0000 mg | ORAL_TABLET | Freq: Every day | ORAL | 3 refills | Status: DC

## 2021-06-06 MED ORDER — TAMSULOSIN HCL 0.4 MG PO CAPS: 0.4000 mg | ORAL_CAPSULE | Freq: Every day | ORAL | 3 refills | Status: DC

## 2021-06-06 MED ORDER — TADALAFIL 5 MG PO TABS: 5.0000 mg | ORAL_TABLET | Freq: Every day | ORAL | 3 refills | Status: DC | PRN

## 2021-06-06 MED ORDER — OMEPRAZOLE 20 MG PO CPDR: 20.0000 mg | DELAYED_RELEASE_CAPSULE | Freq: Every day | ORAL | 3 refills | Status: DC

## 2021-06-06 MED ORDER — AMLODIPINE BESYLATE 5 MG PO TABS: 5.0000 mg | ORAL_TABLET | Freq: Every day | ORAL | 3 refills | Status: DC

## 2021-06-06 NOTE — Patient Instructions (Signed)
It was great seeing you today   We will follow up with you regarding your lab work   Someone will call you to schedule your colonoscopy   I will see you back in one year or sooner if needed

## 2021-06-06 NOTE — Progress Notes (Signed)
Subjective:    Patient ID: Carlos Walker, male    DOB: 1960/09/01, 60 y.o.   MRN: [id number]  HPI Patient presents for yearly preventative medicine examination. 60 year old male who  has a past medical history of Arthritis, Hyperlipidemia, Hypertension, and Pain in lower back.  Essential hypertension-takes Norvasc 5 mg daily.  He denies chest pain, shortness of breath, dizziness, lightheadedness, or headaches.  BP Readings from Last 3 Encounters:  06/06/21 128/82  06/05/20 118/78  10/26/19 116/76   BPH-is controlled with Flomax 0.4 mg daily and Cialis 5 mg daily.  Erectile dysfunction-uses Cialis 5 mg daily with good results  Hyperlipidemia- Takes Crestor 20 mg daily. Denies myalgia or fatigue   Lab Results  Component Value Date   CHOL 260 (H) 06/05/2020   HDL 55 06/05/2020   LDLCALC 184 (H) 06/05/2020   LDLDIRECT 154.1 01/10/2013   TRIG 96 06/05/2020   CHOLHDL 4.7 06/05/2020   GERD - Controlled with Prilosec 20 mg daily.    All immunizations and health maintenance protocols were reviewed with the patient and needed orders were placed.  Appropriate screening laboratory values were ordered for the patient including screening of hyperlipidemia, renal function and hepatic function. If indicated by BPH, a PSA was ordered.  Medication reconciliation,  past medical history, social history, problem list and allergies were reviewed in detail with the patient  Goals were established with regard to weight loss, exercise, and  diet in compliance with medications  He has no acute complaints today   Is due for routine screening colonoscopy   Review of Systems  Constitutional: Negative.   HENT: Negative.    Eyes: Negative.   Respiratory: Negative.    Cardiovascular: Negative.   Gastrointestinal: Negative.   Endocrine: Negative.   Genitourinary: Negative.   Musculoskeletal: Negative.   Skin: Negative.   Allergic/Immunologic: Negative.   Neurological: Negative.    Hematological: Negative.   Psychiatric/Behavioral: Negative.    All other systems reviewed and are negative. Past Medical History:  Diagnosis Date   Arthritis    Hyperlipidemia    Hypertension    Pain in lower back    epidural for pain orthopedics    Social History   Socioeconomic History   Marital status: Married    Spouse name: Not on file   Number of children: Not on file   Years of education: Not on file   Highest education level: Not on file  Occupational History   Not on file  Tobacco Use   Smoking status: Former    Packs/day: 0.25    Years: 20.00    Pack years: 5.00    Types: Cigarettes    Quit date: 09/20/2016    Years since quitting: 4.7   Smokeless tobacco: Never   Tobacco comments:    less than 1/2 pack per day  Vaping Use   Vaping Use: Never used  Substance and Sexual Activity   Alcohol use: No   Drug use: No   Sexual activity: Not on file  Other Topics Concern   Not on file  Social History Narrative   Not on file   Social Determinants of Health   Financial Resource Strain: Not on file  Food Insecurity: Not on file  Transportation Needs: Not on file  Physical Activity: Not on file  Stress: Not on file  Social Connections: Not on file  Intimate Partner Violence: Not on file    Past Surgical History:  Procedure Laterality Date   CIRCUMCISION  Right Knee Surgery Right 01/16/2020   Excision prepatellar ganglion   right side repair     as child from football injury   TONSILLECTOMY     WISDOM TOOTH EXTRACTION      Family History  Problem Relation Age of Onset   Diabetes Mother    Heart disease Mother    Hyperlipidemia Mother    Stroke Mother    Heart disease Father    Heart attack Father    Heart disease Brother    Renal Disease Brother    Hyperlipidemia Sister     No Known Allergies  Current Outpatient Medications on File Prior to Visit  Medication Sig Dispense Refill   pramoxine-hydrocortisone (PROCTOCREAM-HC) 1-1 % rectal  cream Place 1 application rectally 2 (two) times daily. 30 g 2   No current facility-administered medications on file prior to visit.    BP 128/82   Pulse 68   Temp 97.8 F (36.6 C) (Oral)   Ht 6\' 1"  (1.854 m)   Wt 223 lb (101.2 kg)   SpO2 98%   BMI 29.42 kg/m       Objective:   Physical Exam Vitals and nursing note reviewed.  Constitutional:      General: He is not in acute distress.    Appearance: Normal appearance. He is well-developed and normal weight.  HENT:     Head: Normocephalic and atraumatic.     Right Ear: Tympanic membrane, ear canal and external ear normal. There is no impacted cerumen.     Left Ear: Tympanic membrane, ear canal and external ear normal. There is no impacted cerumen.     Nose: Nose normal. No congestion or rhinorrhea.     Mouth/Throat:     Mouth: Mucous membranes are moist.     Pharynx: Oropharynx is clear. No oropharyngeal exudate or posterior oropharyngeal erythema.  Eyes:     General:        Right eye: No discharge.        Left eye: No discharge.     Extraocular Movements: Extraocular movements intact.     Conjunctiva/sclera: Conjunctivae normal.     Pupils: Pupils are equal, round, and reactive to light.  Neck:     Vascular: No carotid bruit.     Trachea: No tracheal deviation.  Cardiovascular:     Rate and Rhythm: Normal rate and regular rhythm.     Pulses: Normal pulses.     Heart sounds: Normal heart sounds. No murmur heard.   No friction rub. No gallop.  Pulmonary:     Effort: Pulmonary effort is normal. No respiratory distress.     Breath sounds: Normal breath sounds. No stridor. No wheezing, rhonchi or rales.  Chest:     Chest wall: No tenderness.  Abdominal:     General: Bowel sounds are normal. There is no distension.     Palpations: Abdomen is soft. There is no mass.     Tenderness: There is no abdominal tenderness. There is no right CVA tenderness, left CVA tenderness, guarding or rebound.     Hernia: No hernia is  present.  Musculoskeletal:        General: No swelling, tenderness, deformity or signs of injury. Normal range of motion.     Right lower leg: No edema.     Left lower leg: No edema.  Lymphadenopathy:     Cervical: No cervical adenopathy.  Skin:    General: Skin is warm and dry.     Capillary Refill:  Capillary refill takes less than 2 seconds.     Coloration: Skin is not jaundiced or pale.     Findings: No bruising, erythema, lesion or rash.  Neurological:     General: No focal deficit present.     Mental Status: He is alert and oriented to person, place, and time.     Cranial Nerves: No cranial nerve deficit.     Sensory: No sensory deficit.     Motor: No weakness.     Coordination: Coordination normal.     Gait: Gait normal.     Deep Tendon Reflexes: Reflexes normal.  Psychiatric:        Mood and Affect: Mood normal.        Behavior: Behavior normal.        Thought Content: Thought content normal.        Judgment: Judgment normal.      Assessment & Plan:  1. Routine general medical examination at a health care facility - Benign exam  - Continue to stay active and eat healthy  - Follow up in one year or sooner if needed - CBC with Differential/Platelet; Future - Comprehensive metabolic panel; Future - Lipid panel; Future - TSH; Future - Hemoglobin A1c; Future - Hemoglobin A1c - TSH - Lipid panel - Comprehensive metabolic panel - CBC with Differential/Platelet  2. Hypertension, essential - Well controlled.  - No change in medications  - CBC with Differential/Platelet; Future - Comprehensive metabolic panel; Future - Lipid panel; Future - TSH; Future - Hemoglobin A1c; Future - amLODipine (NORVASC) 5 MG tablet; Take 1 tablet (5 mg total) by mouth daily.  Dispense: 90 tablet; Refill: 3 - Hemoglobin A1c - TSH - Lipid panel - Comprehensive metabolic panel - CBC with Differential/Platelet  3. Benign prostatic hyperplasia with incomplete bladder emptying  - PSA;  Future - tamsulosin (FLOMAX) 0.4 MG CAPS capsule; Take 1 capsule (0.4 mg total) by mouth daily.  Dispense: 90 capsule; Refill: 3 - tadalafil (CIALIS) 5 MG tablet; Take 1 tablet (5 mg total) by mouth daily as needed for erectile dysfunction.  Dispense: 90 tablet; Refill: 3 - PSA  4. Erectile dysfunction, unspecified erectile dysfunction type  - tadalafil (CIALIS) 5 MG tablet; Take 1 tablet (5 mg total) by mouth daily as needed for erectile dysfunction.  Dispense: 90 tablet; Refill: 3  5. Mixed hyperlipidemia - Consider increase in crestor - CBC with Differential/Platelet; Future - Comprehensive metabolic panel; Future - Lipid panel; Future - TSH; Future - Hemoglobin A1c; Future - rosuvastatin (CRESTOR) 20 MG tablet; Take 1 tablet (20 mg total) by mouth daily.  Dispense: 90 tablet; Refill: 3 - Hemoglobin A1c - TSH - Lipid panel - Comprehensive metabolic panel - CBC with Differential/Platelet  6. Gastroesophageal reflux disease without esophagitis - Controlled.  - omeprazole (PRILOSEC) 20 MG capsule; Take 1 capsule (20 mg total) by mouth daily.  Dispense: 90 capsule; Refill: 3  7. Colon cancer screening  - Ambulatory referral to Gastroenterology  [name]

## 2021-06-13 ENCOUNTER — Encounter: Payer: Self-pay | Admitting: Internal Medicine

## 2021-06-14 MED ORDER — METFORMIN HCL 500 MG PO TABS: 500.0000 mg | ORAL_TABLET | Freq: Every day | ORAL | 0 refills | Status: DC

## 2021-06-14 NOTE — Addendum Note (Signed)
Addended by: Gwenyth Ober R on: 06/14/2021 08:50 AM   Modules accepted: Orders

## 2021-06-18 ENCOUNTER — Other Ambulatory Visit: Payer: Self-pay

## 2021-06-18 ENCOUNTER — Ambulatory Visit (INDEPENDENT_AMBULATORY_CARE_PROVIDER_SITE_OTHER): Payer: 59

## 2021-06-18 DIAGNOSIS — Z23 Encounter for immunization: Secondary | ICD-10-CM | POA: Diagnosis not present

## 2021-06-21 ENCOUNTER — Encounter: Payer: Self-pay | Admitting: Internal Medicine

## 2021-06-21 ENCOUNTER — Ambulatory Visit (AMBULATORY_SURGERY_CENTER): Payer: 59

## 2021-06-21 VITALS — Ht 73.0 in | Wt 220.0 lb

## 2021-06-21 DIAGNOSIS — Z1211 Encounter for screening for malignant neoplasm of colon: Secondary | ICD-10-CM

## 2021-06-21 NOTE — Progress Notes (Signed)
No egg or soy allergy known to patient  No issues known to pt with past sedation with any surgeries or procedures Patient denies ever being told they had issues or difficulty with intubation  No FH of Malignant Hyperthermia Pt is not on diet pills Pt is not on  home 02  Pt is not on blood thinners  Pt denies issues with constipation  No A fib or A flutter  Pt is fully vaccinated  for Covid   Miralax prep   Due to the COVID-19 pandemic we are asking patients to follow certain guidelines in [facility] and the [place]   Pt aware of COVID protocols and [facility] guidelines

## 2021-07-26 ENCOUNTER — Ambulatory Visit (AMBULATORY_SURGERY_CENTER): Payer: 59 | Admitting: Internal Medicine

## 2021-07-26 ENCOUNTER — Encounter: Payer: Self-pay | Admitting: Internal Medicine

## 2021-07-26 VITALS — BP 157/85 | HR 57 | Temp 97.8°F | Resp 16 | Ht 73.0 in | Wt 220.0 lb

## 2021-07-26 DIAGNOSIS — Z1211 Encounter for screening for malignant neoplasm of colon: Secondary | ICD-10-CM

## 2021-07-26 MED ORDER — SODIUM CHLORIDE 0.9 % IV SOLN: 500.0000 mL | Freq: Once | INTRAVENOUS | Status: DC

## 2021-07-26 NOTE — Patient Instructions (Addendum)
No polyps or cancer seen./ Next routine colonoscopy or other screening test in 10 years - 2032.  I appreciate the opportunity to care for you. Gatha Mayer, MD, FACG  YOU HAD AN ENDOSCOPIC PROCEDURE TODAY: Refer to the procedure report and other information in the discharge instructions given to you for any specific questions about what was found during the examination. If this information does not answer your questions, please call [place] office at [phone] to clarify.   YOU SHOULD EXPECT: Some feelings of bloating in the abdomen. Passage of more gas than usual. Walking can help get rid of the air that was put into your GI tract during the procedure and reduce the bloating. If you had a lower endoscopy (such as a colonoscopy or flexible sigmoidoscopy) you may notice spotting of blood in your stool or on the toilet paper. Some abdominal soreness may be present for a day or two, also.  DIET: Your first meal following the procedure should be a light meal and then it is ok to progress to your normal diet. A half-sandwich or bowl of soup is an example of a good first meal. Heavy or fried foods are harder to digest and may make you feel nauseous or bloated. Drink plenty of fluids but you should avoid alcoholic beverages for 24 hours. If you had a esophageal dilation, please see attached instructions for diet.    ACTIVITY: Your care partner should take you home directly after the procedure. You should plan to take it easy, moving slowly for the rest of the day. You can resume normal activity the day after the procedure however YOU SHOULD NOT DRIVE, use power tools, machinery or perform tasks that involve climbing or major physical exertion for 24 hours (because of the sedation medicines used during the test).   SYMPTOMS TO REPORT IMMEDIATELY: A gastroenterologist can be reached at any hour. Please call [phone]  for any of the following symptoms:  Following lower endoscopy (colonoscopy,  flexible sigmoidoscopy) Excessive amounts of blood in the stool  Significant tenderness, worsening of abdominal pains  Swelling of the abdomen that is new, acute  Fever of 100 or higher  FOLLOW UP:  If any biopsies were taken you will be contacted by phone or by letter within the next 1-3 weeks. Call [phone]  if you have not heard about the biopsies in 3 weeks.  Please also call with any specific questions about appointments or follow up tests.

## 2021-07-26 NOTE — Progress Notes (Signed)
Pt's states no medical or surgical changes since previsit or office visit.   VS taken by CW 

## 2021-07-26 NOTE — Op Note (Signed)
[facility] Patient Name: Carlos Walker Procedure Date: 07/26/2021 12:53 PM MRN: [id number] Endoscopist: Gatha Mayer , MD Age: 60 Referring MD:  Date of Birth: 1961/06/02 Gender: Male Account #: [id number] Procedure:                Colonoscopy Indications:              Screening for colorectal malignant neoplasm, Last                            colonoscopy: 2012 Medicines:                Propofol per Anesthesia, Monitored Anesthesia Care Procedure:                Pre-Anesthesia Assessment:                           - Prior to the procedure, a History and Physical                            was performed, and patient medications and                            allergies were reviewed. The patient's tolerance of                            previous anesthesia was also reviewed. The risks                            and benefits of the procedure and the sedation                            options and risks were discussed with the patient.                            All questions were answered, and informed consent                            was obtained. Prior Anticoagulants: The patient has                            taken no previous anticoagulant or antiplatelet                            agents. ASA Grade Assessment: II - A patient with                            mild systemic disease. After reviewing the risks                            and benefits, the patient was deemed in                            satisfactory condition to undergo the procedure.  After obtaining informed consent, the colonoscope                            was passed under direct vision. Throughout the                            procedure, the patient's blood pressure, pulse, and                            oxygen saturations were monitored continuously. The                            Olympus CF-HQ190L [id number]) Colonoscope was                            introduced through the  anus and advanced to the the                            cecum, identified by appendiceal orifice and                            ileocecal valve. The quality of the bowel                            preparation was good. The colonoscopy was performed                            without difficulty. The patient tolerated the                            procedure well. The bowel preparation used was                            Miralax via split dose instruction. Scope In: 1:34:48 PM Scope Out: 1:47:16 PM Scope Withdrawal Time: 0 hours 10 minutes 33 seconds  Total Procedure Duration: 0 hours 12 minutes 28 seconds  Findings:                 The perianal and digital rectal examinations were                            normal. Pertinent negatives include normal prostate                            (size, shape, and consistency).                           The colon (entire examined portion) appeared normal.                           No additional abnormalities were found on                            retroflexion. Complications:            No immediate complications.  Estimated blood loss:                            None. Estimated Blood Loss:     Estimated blood loss: none. Impression:               - The entire examined colon is normal.                           - No specimens collected. Recommendation:           - Repeat colonoscopy in 10 years for screening                            purposes.                           - Patient has a contact number available for                            emergencies. The signs and symptoms of potential                            delayed complications were discussed with the                            patient. Return to normal activities tomorrow.                            Written discharge instructions were provided to the                            patient.                           - Resume previous diet.                           - Continue present  medications. Gatha Mayer, MD 07/26/2021 1:54:54 PM This report has been signed electronically.

## 2021-07-26 NOTE — Progress Notes (Signed)
[facility] History and Physical   Primary Care Physician:  [name], NP   Reason for Procedure:   CRCA screening  Plan:    colonoscopy     HPI: Carlos Walker is a 60 y.o. male for colonoscopy screening CRCA   Past Medical History:  Diagnosis Date   Arthritis    Hyperlipidemia    Hypertension    Pain in lower back    epidural for pain orthopedics   Substance abuse ([place]) 1988   cocaine    Past Surgical History:  Procedure Laterality Date   CIRCUMCISION     COLONOSCOPY     Right Knee Surgery Right 01/16/2020   Excision prepatellar ganglion   right side repair     as child from football injury   TONSILLECTOMY     WISDOM TOOTH EXTRACTION      Prior to Admission medications   Medication Sig Start Date End Date Taking? Authorizing Provider  amLODipine (NORVASC) 5 MG tablet Take 1 tablet (5 mg total) by mouth daily. 06/06/21  Yes Nafziger, Tommi Rumps, NP  BABY ASPIRIN PO Take by mouth.   Yes [provider]  omeprazole (PRILOSEC) 20 MG capsule Take 1 capsule (20 mg total) by mouth daily. 06/06/21  Yes Nafziger, Tommi Rumps, NP  rosuvastatin (CRESTOR) 20 MG tablet Take 1 tablet (20 mg total) by mouth daily. 06/06/21  Yes Nafziger, Tommi Rumps, NP  tadalafil (CIALIS) 5 MG tablet Take 1 tablet (5 mg total) by mouth daily as needed for erectile dysfunction. 06/06/21  Yes Nafziger, Tommi Rumps, NP  tamsulosin (FLOMAX) 0.4 MG CAPS capsule Take 1 capsule (0.4 mg total) by mouth daily. 06/06/21  Yes Nafziger, Tommi Rumps, NP  metFORMIN (GLUCOPHAGE) 500 MG tablet Take 1 tablet (500 mg total) by mouth daily. Patient not taking: Reported on 07/26/2021 06/14/21   [name], NP  pramoxine-hydrocortisone (PROCTOCREAM-HC) 1-1 % rectal cream Place 1 application rectally 2 (two) times daily. Patient taking differently: Place 1 application rectally 2 (two) times daily as needed. 05/01/21   [name], NP    Current Outpatient Medications  Medication Sig Dispense Refill   amLODipine  (NORVASC) 5 MG tablet Take 1 tablet (5 mg total) by mouth daily. 90 tablet 3   BABY ASPIRIN PO Take by mouth.     omeprazole (PRILOSEC) 20 MG capsule Take 1 capsule (20 mg total) by mouth daily. 90 capsule 3   rosuvastatin (CRESTOR) 20 MG tablet Take 1 tablet (20 mg total) by mouth daily. 90 tablet 3   tadalafil (CIALIS) 5 MG tablet Take 1 tablet (5 mg total) by mouth daily as needed for erectile dysfunction. 90 tablet 3   tamsulosin (FLOMAX) 0.4 MG CAPS capsule Take 1 capsule (0.4 mg total) by mouth daily. 90 capsule 3   metFORMIN (GLUCOPHAGE) 500 MG tablet Take 1 tablet (500 mg total) by mouth daily. (Patient not taking: Reported on 07/26/2021) 90 tablet 0   pramoxine-hydrocortisone (PROCTOCREAM-HC) 1-1 % rectal cream Place 1 application rectally 2 (two) times daily. (Patient taking differently: Place 1 application rectally 2 (two) times daily as needed.) 30 g 2   Current Facility-Administered Medications  Medication Dose Route Frequency Provider Last Rate Last Admin   0.9 %  sodium chloride infusion  500 mL Intravenous Once Gatha Mayer, MD        Allergies as of 07/26/2021   (No Known Allergies)    Family History  Problem Relation Age of Onset   Diabetes Mother    Heart disease Mother  Hyperlipidemia Mother    Stroke Mother    Heart disease Father    Heart attack Father    Hyperlipidemia Sister    Heart disease Brother    Renal Disease Brother    Colon cancer Neg Hx    Colon polyps Neg Hx    Esophageal cancer Neg Hx    Rectal cancer Neg Hx    Stomach cancer Neg Hx     Social History   Socioeconomic History   Marital status: Married    Spouse name: Not on file   Number of children: Not on file   Years of education: Not on file   Highest education level: Not on file  Occupational History   Not on file  Tobacco Use   Smoking status: Former    Packs/day: 0.25    Years: 20.00    Pack years: 5.00    Types: Cigarettes    Quit date: 09/20/2016    Years since  quitting: 4.8   Smokeless tobacco: Never   Tobacco comments:    less than 1/2 pack per day  Vaping Use   Vaping Use: Never used  Substance and Sexual Activity   Alcohol use: Never   Drug use: Not Currently    Types: Cocaine    Comment: quit 1988   Sexual activity: Not on file  Other Topics Concern   Not on file  Social History Narrative   Not on file   Social Determinants of Health   Financial Resource Strain: Not on file  Food Insecurity: Not on file  Transportation Needs: Not on file  Physical Activity: Not on file  Stress: Not on file  Social Connections: Not on file  Intimate Partner Violence: Not on file    Review of Systems:  All other review of systems negative except as mentioned in the HPI.  Physical Exam: Vital signs BP (!) 145/88    Pulse 62    Temp 97.8 F (36.6 C)    Resp 17    Ht 6\' 1"  (1.854 m)    Wt 220 lb (99.8 kg)    SpO2 99%    BMI 29.03 kg/m   General:   Alert,  Well-developed, well-nourished, pleasant and cooperative in NAD Lungs:  Clear throughout to auscultation.   Heart:  Regular rate and rhythm; no murmurs, clicks, rubs,  or gallops. Abdomen:  Soft, nontender and nondistended. Normal bowel sounds.   Neuro/Psych:  Alert and cooperative. Normal mood and affect. A and O x 3   @Chrishawn Boley  Simonne Maffucci, MD, [facility] [phone] (pager) 07/26/2021 1:31 PM@

## 2021-07-30 ENCOUNTER — Telehealth: Payer: Self-pay

## 2021-07-30 NOTE — Telephone Encounter (Signed)
°  Follow up Call-  Call back number 07/26/2021  Post procedure Call Back phone  # [phone]  Permission to leave phone message Yes  Some recent data might be hidden     Patient questions:  Do you have a fever, pain , or abdominal swelling? No. Pain Score  0 *  Have you tolerated food without any problems? Yes.    Have you been able to return to your normal activities? Yes.    Do you have any questions about your discharge instructions: Diet   No. Medications  No. Follow up visit  No.  Do you have questions or concerns about your Care? No.  Actions: * If pain score is 4 or above: No action needed, pain <4.  Have you developed a fever since your procedure? no  2.   Have you had an respiratory symptoms (SOB or cough) since your procedure? no  3.   Have you tested positive for COVID 19 since your procedure no  4.   Have you had any family members/close contacts diagnosed with the COVID 19 since your procedure?  no   If yes to any of these questions please route to Joylene John, RN and Joella Prince, RN

## 2021-09-03 ENCOUNTER — Encounter: Payer: Self-pay | Admitting: Adult Health

## 2021-09-03 ENCOUNTER — Telehealth (INDEPENDENT_AMBULATORY_CARE_PROVIDER_SITE_OTHER): Payer: 59 | Admitting: Adult Health

## 2021-09-03 DIAGNOSIS — U071 COVID-19: Secondary | ICD-10-CM | POA: Diagnosis not present

## 2021-09-03 MED ORDER — MOLNUPIRAVIR EUA 200MG CAPSULE: 4.0000 | ORAL_CAPSULE | Freq: Two times a day (BID) | ORAL | 0 refills | Status: AC

## 2021-09-03 NOTE — Progress Notes (Signed)
Virtual Visit via Video Note  I connected with Carlos Walker on 09/03/21 at  4:30 PM EST by a video enabled telemedicine application and verified that I am speaking with the correct person using two identifiers.  Location patient: home Location provider:work or home office Persons participating in the virtual visit: patient, provider  I discussed the limitations of evaluation and management by telemedicine and the availability of in person appointments. The patient expressed understanding and agreed to proceed.   HPI: 61 year old male who  has a past medical history of Arthritis, Hyperlipidemia, Hypertension, Pain in lower back, and Substance abuse ([place]) (1988).  He tested positive this morning for COVID 19 His symptoms started yesterday with a tickle in his throat and progressed throughout the day with chills, fatigue and generalized body aches, joint pain, sinus congestion.   He denies fevers, shortness of breath, n/v/d  He is interested in anti viral therapy.   He has had three shots of the covid vaccination    ROS: See pertinent positives and negatives per HPI.  Past Medical History:  Diagnosis Date   Arthritis    Hyperlipidemia    Hypertension    Pain in lower back    epidural for pain orthopedics   Substance abuse ([place]) 1988   cocaine    Past Surgical History:  Procedure Laterality Date   CIRCUMCISION     COLONOSCOPY     Right Knee Surgery Right 01/16/2020   Excision prepatellar ganglion   right side repair     as child from football injury   TONSILLECTOMY     WISDOM TOOTH EXTRACTION      Family History  Problem Relation Age of Onset   Diabetes Mother    Heart disease Mother    Hyperlipidemia Mother    Stroke Mother    Heart disease Father    Heart attack Father    Hyperlipidemia Sister    Heart disease Brother    Renal Disease Brother    Colon cancer Neg Hx    Colon polyps Neg Hx    Esophageal cancer Neg Hx    Rectal cancer Neg Hx    Stomach cancer  Neg Hx        Current Outpatient Medications:    amLODipine (NORVASC) 5 MG tablet, Take 1 tablet (5 mg total) by mouth daily., Disp: 90 tablet, Rfl: 3   Ascorbic Acid (VITAMIN C PO), Take by mouth daily., Disp: , Rfl:    BABY ASPIRIN PO, Take by mouth., Disp: , Rfl:    omeprazole (PRILOSEC) 20 MG capsule, Take 1 capsule (20 mg total) by mouth daily., Disp: 90 capsule, Rfl: 3   pramoxine-hydrocortisone (PROCTOCREAM-HC) 1-1 % rectal cream, Place 1 application rectally 2 (two) times daily. (Patient taking differently: Place 1 application rectally 2 (two) times daily as needed.), Disp: 30 g, Rfl: 2   rosuvastatin (CRESTOR) 20 MG tablet, Take 1 tablet (20 mg total) by mouth daily., Disp: 90 tablet, Rfl: 3   tadalafil (CIALIS) 5 MG tablet, Take 1 tablet (5 mg total) by mouth daily as needed for erectile dysfunction., Disp: 90 tablet, Rfl: 3   tamsulosin (FLOMAX) 0.4 MG CAPS capsule, Take 1 capsule (0.4 mg total) by mouth daily., Disp: 90 capsule, Rfl: 3  EXAM:  VITALS per patient if applicable:  GENERAL: alert, oriented, appears well and in no acute distress  HEENT: atraumatic, conjunttiva clear, no obvious abnormalities on inspection of external nose and ears  NECK: normal movements of the head  and neck  LUNGS: on inspection no signs of respiratory distress, breathing rate appears normal, no obvious gross SOB, gasping or wheezing  CV: no obvious cyanosis  MS: moves all visible extremities without noticeable abnormality  PSYCH/NEURO: pleasant and cooperative, no obvious depression or anxiety, speech and thought processing grossly intact  ASSESSMENT AND PLAN:  Discussed the following assessment and plan:  1. COVID-19 virus infection  - molnupiravir EUA (LAGEVRIO) 200 mg CAPS capsule; Take 4 capsules (800 mg total) by mouth 2 (two) times daily for 5 days.  Dispense: 40 capsule; Refill: 0 - Follow up if symptoms worsen  - Work note given   [name], NP       I discussed  the assessment and treatment plan with the patient. The patient was provided an opportunity to ask questions and all were answered. The patient agreed with the plan and demonstrated an understanding of the instructions.   The patient was advised to call back or seek an in-person evaluation if the symptoms worsen or if the condition fails to improve as anticipated.   [name], NP

## 2021-09-10 ENCOUNTER — Encounter: Payer: Self-pay | Admitting: Adult Health

## 2021-09-10 ENCOUNTER — Telehealth (INDEPENDENT_AMBULATORY_CARE_PROVIDER_SITE_OTHER): Payer: 59 | Admitting: Adult Health

## 2021-09-10 VITALS — Ht 73.0 in | Wt 225.0 lb

## 2021-09-10 DIAGNOSIS — R21 Rash and other nonspecific skin eruption: Secondary | ICD-10-CM

## 2021-09-10 NOTE — Progress Notes (Signed)
Virtual Visit via Video Note  I connected with Carlos Walker on 09/10/21 at  3:00 PM EST by a video enabled telemedicine application and verified that I am speaking with the correct person using two identifiers.  Location patient: home Location provider:work or home office Persons participating in the virtual visit: patient, provider  I discussed the limitations of evaluation and management by telemedicine and the availability of in person appointments. The patient expressed understanding and agreed to proceed.   HPI: 61 year old male who is being evaluated today for an acute issue.  Was recently diagnosed with COVID-19 6 days ago.  3 days ago he developed a red itchy rash on his inner thighs and abdomen.  He took 50 mg of Benadryl yesterday and noticed improvement in the rash today.  He denies changes in detergents or soaps   ROS: See pertinent positives and negatives per HPI.  Past Medical History:  Diagnosis Date   Arthritis    Hyperlipidemia    Hypertension    Pain in lower back    epidural for pain orthopedics   Substance abuse ([place]) 1988   cocaine    Past Surgical History:  Procedure Laterality Date   CIRCUMCISION     COLONOSCOPY     Right Knee Surgery Right 01/16/2020   Excision prepatellar ganglion   right side repair     as child from football injury   TONSILLECTOMY     WISDOM TOOTH EXTRACTION      Family History  Problem Relation Age of Onset   Diabetes Mother    Heart disease Mother    Hyperlipidemia Mother    Stroke Mother    Heart disease Father    Heart attack Father    Hyperlipidemia Sister    Heart disease Brother    Renal Disease Brother    Colon cancer Neg Hx    Colon polyps Neg Hx    Esophageal cancer Neg Hx    Rectal cancer Neg Hx    Stomach cancer Neg Hx        Current Outpatient Medications:    amLODipine (NORVASC) 5 MG tablet, Take 1 tablet (5 mg total) by mouth daily., Disp: 90 tablet, Rfl: 3   Ascorbic Acid (VITAMIN C PO), Take  by mouth daily., Disp: , Rfl:    BABY ASPIRIN PO, Take by mouth., Disp: , Rfl:    omeprazole (PRILOSEC) 20 MG capsule, Take 1 capsule (20 mg total) by mouth daily., Disp: 90 capsule, Rfl: 3   pramoxine-hydrocortisone (PROCTOCREAM-HC) 1-1 % rectal cream, Place 1 application rectally 2 (two) times daily. (Patient taking differently: Place 1 application rectally 2 (two) times daily as needed.), Disp: 30 g, Rfl: 2   rosuvastatin (CRESTOR) 20 MG tablet, Take 1 tablet (20 mg total) by mouth daily., Disp: 90 tablet, Rfl: 3   tadalafil (CIALIS) 5 MG tablet, Take 1 tablet (5 mg total) by mouth daily as needed for erectile dysfunction., Disp: 90 tablet, Rfl: 3   tamsulosin (FLOMAX) 0.4 MG CAPS capsule, Take 1 capsule (0.4 mg total) by mouth daily., Disp: 90 capsule, Rfl: 3  EXAM:  VITALS per patient if applicable:  GENERAL: alert, oriented, appears well and in no acute distress  HEENT: atraumatic, conjunttiva clear, no obvious abnormalities on inspection of external nose and ears  NECK: normal movements of the head and neck  LUNGS: on inspection no signs of respiratory distress, breathing rate appears normal, no obvious gross SOB, gasping or wheezing  CV: no obvious cyanosis  MS:  moves all visible extremities without noticeable abnormality  PSYCH/NEURO: pleasant and cooperative, no obvious depression or anxiety, speech and thought processing grossly intact  SKIN: Appears as of red raised rash like Urticaria on inner thighs  ASSESSMENT AND PLAN:  Discussed the following assessment and plan:  1. Rash -Possible rash from antiviral cannot rule out pityriasis rosea.  -Advised to continue with Benadryl nightly and use hydrocortisone cream twice daily for the next 2 to 3 days.  Follow-up if symptoms continue past that      I discussed the assessment and treatment plan with the patient. The patient was provided an opportunity to ask questions and all were answered. The patient agreed with the  plan and demonstrated an understanding of the instructions.   The patient was advised to call back or seek an in-person evaluation if the symptoms worsen or if the condition fails to improve as anticipated.   [name], NP

## 2021-09-17 ENCOUNTER — Ambulatory Visit: Payer: 59 | Admitting: Adult Health

## 2021-09-17 ENCOUNTER — Encounter: Payer: Self-pay | Admitting: Adult Health

## 2021-09-17 VITALS — BP 110/70 | HR 77 | Temp 97.7°F | Ht 73.0 in | Wt 223.0 lb

## 2021-09-17 DIAGNOSIS — R7303 Prediabetes: Secondary | ICD-10-CM

## 2021-09-17 DIAGNOSIS — I10 Essential (primary) hypertension: Secondary | ICD-10-CM | POA: Diagnosis not present

## 2021-09-17 DIAGNOSIS — Z23 Encounter for immunization: Secondary | ICD-10-CM | POA: Diagnosis not present

## 2021-09-17 LAB — POCT GLYCOSYLATED HEMOGLOBIN (HGB A1C): Hemoglobin A1C: 6.2 % — AB (ref 4.0–5.6)

## 2021-09-17 NOTE — Patient Instructions (Signed)
Your blood sugar is coming down   Your A1c dropped from 6.5 to 6.2   Keep working on reducing carbs/sugars and exercising   Lets see each other in 4 months

## 2021-09-17 NOTE — Progress Notes (Signed)
Subjective:    Patient ID: Carlos Walker, male    DOB: 05-09-1961, 61 y.o.   MRN: [id number]  HPI  61 year old male who  has a past medical history of Arthritis, Hyperlipidemia, Hypertension, Pain in lower back, and Substance abuse ([place]) (1988).  He presents to the office today for follow up regarding pre diabetes and HTN . About three months ago his A1c was 6.5. He did not want to take Metformin at this time instead work on lifestyle modification. He has cut back on snacking, carbs, and sugars and is taking his dog on walks more during the week.   Wt Readings from Last 3 Encounters:  09/17/21 223 lb (101.2 kg)  09/10/21 225 lb (102.1 kg)  07/26/21 220 lb (99.8 kg)   Lab Results  Component Value Date   HGBA1C 6.5 06/06/2021   HTN - well managed with Norvasc 5 mg daily. He denies dizziness, lightheadedness, blurred vision.   He also needs his second shingles vaccination    Review of Systems See HPI   Past Medical History:  Diagnosis Date   Arthritis    Hyperlipidemia    Hypertension    Pain in lower back    epidural for pain orthopedics   Substance abuse ([place]) 1988   cocaine    Social History   Socioeconomic History   Marital status: Married    Spouse name: Not on file   Number of children: Not on file   Years of education: Not on file   Highest education level: Not on file  Occupational History   Not on file  Tobacco Use   Smoking status: Former    Packs/day: 0.25    Years: 20.00    Pack years: 5.00    Types: Cigarettes    Quit date: 09/20/2016    Years since quitting: 4.9   Smokeless tobacco: Never   Tobacco comments:    less than 1/2 pack per day  Vaping Use   Vaping Use: Never used  Substance and Sexual Activity   Alcohol use: Never   Drug use: Not Currently    Types: Cocaine    Comment: quit 1988   Sexual activity: Not on file  Other Topics Concern   Not on file  Social History Narrative   Not on file   Social Determinants of Health    Financial Resource Strain: Not on file  Food Insecurity: Not on file  Transportation Needs: Not on file  Physical Activity: Not on file  Stress: Not on file  Social Connections: Not on file  Intimate Partner Violence: Not on file    Past Surgical History:  Procedure Laterality Date   CIRCUMCISION     COLONOSCOPY     Right Knee Surgery Right 01/16/2020   Excision prepatellar ganglion   right side repair     as child from football injury   TONSILLECTOMY     WISDOM TOOTH EXTRACTION      Family History  Problem Relation Age of Onset   Diabetes Mother    Heart disease Mother    Hyperlipidemia Mother    Stroke Mother    Heart disease Father    Heart attack Father    Hyperlipidemia Sister    Heart disease Brother    Renal Disease Brother    Colon cancer Neg Hx    Colon polyps Neg Hx    Esophageal cancer Neg Hx    Rectal cancer Neg Hx    Stomach cancer  Neg Hx     No Known Allergies  Current Outpatient Medications on File Prior to Visit  Medication Sig Dispense Refill   amLODipine (NORVASC) 5 MG tablet Take 1 tablet (5 mg total) by mouth daily. 90 tablet 3   Ascorbic Acid (VITAMIN C PO) Take by mouth daily.     BABY ASPIRIN PO Take by mouth.     omeprazole (PRILOSEC) 20 MG capsule Take 1 capsule (20 mg total) by mouth daily. 90 capsule 3   pramoxine-hydrocortisone (PROCTOCREAM-HC) 1-1 % rectal cream Place 1 application rectally 2 (two) times daily. (Patient taking differently: Place 1 application rectally 2 (two) times daily as needed.) 30 g 2   rosuvastatin (CRESTOR) 20 MG tablet Take 1 tablet (20 mg total) by mouth daily. 90 tablet 3   tadalafil (CIALIS) 5 MG tablet Take 1 tablet (5 mg total) by mouth daily as needed for erectile dysfunction. 90 tablet 3   tamsulosin (FLOMAX) 0.4 MG CAPS capsule Take 1 capsule (0.4 mg total) by mouth daily. 90 capsule 3   No current facility-administered medications on file prior to visit.    BP 110/70    Pulse 77    Temp 97.7 F  (36.5 C) (Oral)    Ht 6\' 1"  (1.854 m)    Wt 223 lb (101.2 kg)    SpO2 94%    BMI 29.42 kg/m       Objective:   Physical Exam Vitals and nursing note reviewed.  Constitutional:      Appearance: Normal appearance.  Cardiovascular:     Rate and Rhythm: Normal rate and regular rhythm.     Pulses: Normal pulses.     Heart sounds: Normal heart sounds.  Pulmonary:     Effort: Pulmonary effort is normal.     Breath sounds: Normal breath sounds.  Musculoskeletal:        General: Normal range of motion.  Skin:    General: Skin is warm and dry.     Capillary Refill: Capillary refill takes less than 2 seconds.  Neurological:     General: No focal deficit present.     Mental Status: He is alert and oriented to person, place, and time.  Psychiatric:        Mood and Affect: Mood normal.        Behavior: Behavior normal.        Thought Content: Thought content normal.        Judgment: Judgment normal.       Assessment & Plan:  1. Pre-diabetes  - POC HgB A1c- 6.2  - Improved  - Continue with lifestyle modifications  - Follow up in 4 months   2. Need for shingles vaccine  - Varicella-zoster vaccine IM  3. Hypertension, essential - Well controlled.  - No change in medications   [name], NP

## 2021-10-03 ENCOUNTER — Ambulatory Visit: Payer: 59 | Admitting: Podiatry

## 2021-10-03 ENCOUNTER — Other Ambulatory Visit: Payer: Self-pay

## 2021-10-03 ENCOUNTER — Ambulatory Visit (INDEPENDENT_AMBULATORY_CARE_PROVIDER_SITE_OTHER): Payer: 59

## 2021-10-03 ENCOUNTER — Encounter: Payer: Self-pay | Admitting: Podiatry

## 2021-10-03 DIAGNOSIS — L84 Corns and callosities: Secondary | ICD-10-CM | POA: Diagnosis not present

## 2021-10-03 DIAGNOSIS — M722 Plantar fascial fibromatosis: Secondary | ICD-10-CM

## 2021-10-03 DIAGNOSIS — M7752 Other enthesopathy of left foot: Secondary | ICD-10-CM

## 2021-10-03 MED ORDER — TRIAMCINOLONE ACETONIDE 10 MG/ML IJ SUSP
10.0000 mg | Freq: Once | INTRAMUSCULAR | Status: AC
  Administered 2021-10-03: 10 mg

## 2021-10-04 NOTE — Progress Notes (Signed)
Subjective:   Patient ID: Carlos Walker, male   DOB: 61 y.o.   MRN: [id number]   HPI Patient states he has had pain in the left heel which been present for a fairly long time and also gets pain in his forefoot which seems to be newer than the pain in the heel.  Not a great historian but does give me information that these 2 areas seem to be connected to each other and patient does not smoke likes to be active   Review of Systems  All other systems reviewed and are negative.      Objective:  Physical Exam Vitals and nursing note reviewed.  Constitutional:      Appearance: He is well-developed.  Pulmonary:     Effort: Pulmonary effort is normal.  Musculoskeletal:        General: Normal range of motion.  Skin:    General: Skin is warm.  Neurological:     Mental Status: He is alert.    Neurovascular status intact muscle strength adequate range of motion adequate with patient found to have inflammation and pain of the left plantar fascia at the insertion of the tendon into the calcaneus and mild discomfort in the left second metatarsal phalangeal joint with fluid around the joint surface but mild in its intensity.  Patient has flatfoot deformity bilateral good digital perfusion well oriented x3     Assessment:  Acute fasciitis of the left heel moderate in nature along with probable compensatory  capsulitis left and flatfoot     Plan:  H&P reviewed conditions and went ahead today and I am focusing on the heel and I did sterile prep and injected the plantar fascial left 3 mg Kenalog 5 mg Xylocaine applied fascial brace to lift up the arch placed a metatarsal pad to take pressure off the forefoot and placed on diclofenac 75 mg twice daily.  Reappoint may need treatment for the left forefoot that will be decided based on response to this conservative treatment plan  X-rays indicate moderate flatfoot deformity did not note severe deformity currently

## 2021-10-07 ENCOUNTER — Telehealth: Payer: Self-pay | Admitting: Podiatry

## 2021-10-07 NOTE — Telephone Encounter (Signed)
Carlos Walker, pleae call patient to explain how to use the brace

## 2021-10-07 NOTE — Telephone Encounter (Signed)
Patient called left message on voicemail , for nurse to call him to explain how to put heel brace back on.

## 2021-10-11 NOTE — Telephone Encounter (Signed)
Called patient back, no answer. Voicemail left with callback number. Advised patient to come by the office if he would like an assistant to teach him how to put the brace on or he can callback to get step by step directions over the phone. ?

## 2021-10-24 ENCOUNTER — Ambulatory Visit: Payer: Self-pay | Admitting: Podiatry

## 2021-11-28 ENCOUNTER — Ambulatory Visit (INDEPENDENT_AMBULATORY_CARE_PROVIDER_SITE_OTHER): Payer: Self-pay | Admitting: Podiatry

## 2021-11-28 ENCOUNTER — Encounter: Payer: Self-pay | Admitting: Podiatry

## 2021-11-28 DIAGNOSIS — M7752 Other enthesopathy of left foot: Secondary | ICD-10-CM

## 2021-11-28 MED ORDER — TRIAMCINOLONE ACETONIDE 10 MG/ML IJ SUSP
10.0000 mg | Freq: Once | INTRAMUSCULAR | Status: AC
  Administered 2021-11-28: 10 mg

## 2021-11-28 NOTE — Progress Notes (Signed)
Subjective:  ? ?Patient ID: Carlos Walker, male   DOB: 61 y.o.   MRN: [id number]  ? ?HPI ?Patient presents stating he has some inflammation pain around his first metatarsal head left and states he has a small flap of skin that can get irritated has been there for a long time.  States it feels like there is a callus also associated with it ? ? ?ROS ? ? ?   ?Objective:  ?Physical Exam  ?Neurovascular status intact with patient found to have inflammation on the medial side of the first metatarsal head left that is localized with a small fissure at the crease of the metatarsal phalangeal joint.  There is no break in skin there is no drainage noted there is some fluid which which indicates inflammation but keratotic tissue formation ? ?   ?Assessment:  ?Very difficult place for this patient has this chronic fissure with irritation plantar left first metatarsal head ? ?   ?Plan:  ?Reviewed this and today I went ahead and I did inject into the plantar capsule first MPJ 3 mg Dexasone Kenalog 5 mg Xylocaine and then debrided tissue to try to reduce around the fissure site.  I do not see any surgery that would be of benefit to this and I am hoping what I do and will give him relief and he will be seen back as symptoms indicate. ?   ? ? ?

## 2022-01-15 ENCOUNTER — Ambulatory Visit: Payer: 59 | Admitting: Adult Health

## 2022-06-24 ENCOUNTER — Other Ambulatory Visit: Payer: Self-pay | Admitting: Adult Health

## 2022-06-24 DIAGNOSIS — K219 Gastro-esophageal reflux disease without esophagitis: Secondary | ICD-10-CM

## 2022-06-24 DIAGNOSIS — N401 Enlarged prostate with lower urinary tract symptoms: Secondary | ICD-10-CM

## 2022-06-24 DIAGNOSIS — N529 Male erectile dysfunction, unspecified: Secondary | ICD-10-CM

## 2022-06-24 DIAGNOSIS — E782 Mixed hyperlipidemia: Secondary | ICD-10-CM

## 2022-06-24 NOTE — Telephone Encounter (Signed)
Pt is due for CPE

## 2022-07-22 ENCOUNTER — Other Ambulatory Visit: Payer: Self-pay | Admitting: Adult Health

## 2022-07-22 DIAGNOSIS — I10 Essential (primary) hypertension: Secondary | ICD-10-CM

## 2022-07-22 DIAGNOSIS — N401 Enlarged prostate with lower urinary tract symptoms: Secondary | ICD-10-CM

## 2022-07-22 NOTE — Telephone Encounter (Signed)
Patient need to schedule an ov for more refills. 

## 2022-07-28 ENCOUNTER — Other Ambulatory Visit: Payer: Self-pay | Admitting: Adult Health

## 2022-07-28 DIAGNOSIS — E782 Mixed hyperlipidemia: Secondary | ICD-10-CM

## 2022-07-28 DIAGNOSIS — K219 Gastro-esophageal reflux disease without esophagitis: Secondary | ICD-10-CM

## 2022-08-29 ENCOUNTER — Other Ambulatory Visit: Payer: Self-pay | Admitting: Adult Health

## 2022-08-29 DIAGNOSIS — K219 Gastro-esophageal reflux disease without esophagitis: Secondary | ICD-10-CM

## 2022-08-29 DIAGNOSIS — E782 Mixed hyperlipidemia: Secondary | ICD-10-CM

## 2022-09-24 ENCOUNTER — Ambulatory Visit: Payer: Self-pay | Admitting: Adult Health

## 2022-09-25 ENCOUNTER — Ambulatory Visit (INDEPENDENT_AMBULATORY_CARE_PROVIDER_SITE_OTHER): Payer: Self-pay | Admitting: Adult Health

## 2022-09-25 VITALS — BP 150/100 | HR 64 | Temp 97.6°F | Ht 73.0 in | Wt 224.0 lb

## 2022-09-25 DIAGNOSIS — N529 Male erectile dysfunction, unspecified: Secondary | ICD-10-CM

## 2022-09-25 DIAGNOSIS — R7303 Prediabetes: Secondary | ICD-10-CM

## 2022-09-25 DIAGNOSIS — R3914 Feeling of incomplete bladder emptying: Secondary | ICD-10-CM

## 2022-09-25 DIAGNOSIS — K219 Gastro-esophageal reflux disease without esophagitis: Secondary | ICD-10-CM

## 2022-09-25 DIAGNOSIS — Z0000 Encounter for general adult medical examination without abnormal findings: Secondary | ICD-10-CM

## 2022-09-25 DIAGNOSIS — N401 Enlarged prostate with lower urinary tract symptoms: Secondary | ICD-10-CM

## 2022-09-25 DIAGNOSIS — I10 Essential (primary) hypertension: Secondary | ICD-10-CM

## 2022-09-25 LAB — HEMOGLOBIN A1C: Hgb A1c MFr Bld: 6.5 % (ref 4.6–6.5)

## 2022-09-25 LAB — BASIC METABOLIC PANEL
BUN: 15 mg/dL (ref 6–23)
CO2: 28 mEq/L (ref 19–32)
Calcium: 9.4 mg/dL (ref 8.4–10.5)
Chloride: 102 mEq/L (ref 96–112)
Creatinine, Ser: 1.23 mg/dL (ref 0.40–1.50)
GFR: 63.06 mL/min (ref >60.00)
Glucose, Bld: 84 mg/dL (ref 70–99)
Potassium: 4 mEq/L (ref 3.5–5.1)
Sodium: 138 mEq/L (ref 135–145)

## 2022-09-25 MED ORDER — AMLODIPINE BESYLATE 5 MG PO TABS: 5.0000 mg | ORAL_TABLET | Freq: Every day | ORAL | 3 refills | Status: DC

## 2022-09-25 MED ORDER — OMEPRAZOLE 20 MG PO CPDR: 20.0000 mg | DELAYED_RELEASE_CAPSULE | Freq: Every day | ORAL | 0 refills | Status: DC

## 2022-09-25 MED ORDER — TADALAFIL 20 MG PO TABS: ORAL_TABLET | ORAL | 3 refills | Status: DC

## 2022-09-25 MED ORDER — FINASTERIDE 5 MG PO TABS: 5.0000 mg | ORAL_TABLET | Freq: Every day | ORAL | 3 refills | Status: DC

## 2022-09-25 NOTE — Patient Instructions (Signed)
I am going to change your Flomax to Proscar to see if this helps better for prostate issues and urination   I am going to check some lab work on you today   All the rest of your medications have been sent in

## 2022-09-25 NOTE — Progress Notes (Signed)
Subjective:    Patient ID: Carlos Walker, male    DOB: 09/01/1960, 62 y.o.   MRN: [id number]  HPI Patient presents for yearly preventative medicine examination. He is a pleasant 62 year old male who  has a past medical history of Arthritis, Hyperlipidemia, Hypertension, Pain in lower back, and Substance abuse ([place]) (1988).  He is currently out of a job and does not have health insurance    Essential Hypertension -managed with Norvasc 5 mg daily.  He has been out of his blood pressure medication for close to 3 weeks.  Denies dizziness, lightheadedness, chest pain, shortness of breath, or headaches  BP Readings from Last 3 Encounters:  09/25/22 (!) 150/100  09/17/21 110/70  07/26/21 (!) 157/85   Hyperlipidemia-managed with Crestor 20 mg daily.  He denies myalgia or fatigue. Lab Results  Component Value Date   CHOL 133 06/06/2021   HDL 54.00 06/06/2021   LDLCALC 70 06/06/2021   LDLDIRECT 154.1 01/10/2013   TRIG 43.0 06/06/2021   CHOLHDL 2 06/06/2021   GERD -Takes Prilosec 20 mg as needed.  Feels well-controlled on this dose  BPH-was on Flomax 0.4 mg daily but did not see any benefit in this.  He continues to get up about 4-5 times a night to urinate.  ED-Cialis 5 mg in the past but did not get good benefit from this dose.  Pre diabetes - not currently on medication  Lab Results  Component Value Date   HGBA1C 6.2 (A) 09/17/2021    All immunizations and health maintenance protocols were reviewed with the patient and needed orders were placed.  Appropriate screening laboratory values were ordered for the patient including screening of hyperlipidemia, renal function and hepatic function.   Medication reconciliation,  past medical history, social history, problem list and allergies were reviewed in detail with the patient  Goals were established with regard to weight loss, exercise, and  diet in compliance with medications  Review of Systems  Constitutional: Negative.    HENT: Negative.    Eyes: Negative.   Respiratory: Negative.    Cardiovascular: Negative.   Gastrointestinal: Negative.   Endocrine: Negative.   Genitourinary: Negative.   Musculoskeletal: Negative.   Skin: Negative.   Allergic/Immunologic: Negative.   Neurological: Negative.   Hematological: Negative.   Psychiatric/Behavioral: Negative.    All other systems reviewed and are negative.  Past Medical History:  Diagnosis Date   Arthritis    Hyperlipidemia    Hypertension    Pain in lower back    epidural for pain orthopedics   Substance abuse ([place]) 1988   cocaine    Social History   Socioeconomic History   Marital status: Married    Spouse name: Not on file   Number of children: Not on file   Years of education: Not on file   Highest education level: Not on file  Occupational History   Not on file  Tobacco Use   Smoking status: Former    Packs/day: 0.25    Years: 20.00    Total pack years: 5.00    Types: Cigarettes    Quit date: 09/20/2016    Years since quitting: 6.0   Smokeless tobacco: Never   Tobacco comments:    less than 1/2 pack per day  Vaping Use   Vaping Use: Never used  Substance and Sexual Activity   Alcohol use: Never   Drug use: Not Currently    Types: Cocaine    Comment: quit 1988  Sexual activity: Not on file  Other Topics Concern   Not on file  Social History Narrative   Not on file   Social Determinants of Health   Financial Resource Strain: Not on file  Food Insecurity: Not on file  Transportation Needs: Not on file  Physical Activity: Not on file  Stress: Not on file  Social Connections: Not on file  Intimate Partner Violence: Not on file    Past Surgical History:  Procedure Laterality Date   CIRCUMCISION     COLONOSCOPY     Right Knee Surgery Right 01/16/2020   Excision prepatellar ganglion   right side repair     as child from football injury   TONSILLECTOMY     WISDOM TOOTH EXTRACTION      Family History  Problem  Relation Age of Onset   Diabetes Mother    Heart disease Mother    Hyperlipidemia Mother    Stroke Mother    Heart disease Father    Heart attack Father    Hyperlipidemia Sister    Heart disease Brother    Renal Disease Brother    Colon cancer Neg Hx    Colon polyps Neg Hx    Esophageal cancer Neg Hx    Rectal cancer Neg Hx    Stomach cancer Neg Hx     No Known Allergies  Current Outpatient Medications on File Prior to Visit  Medication Sig Dispense Refill   BABY ASPIRIN PO Take by mouth.     rosuvastatin (CRESTOR) 20 MG tablet TAKE 1 TABLET BY MOUTH DAILY 30 tablet 0   Ascorbic Acid (VITAMIN C PO) Take by mouth daily. (Patient not taking: Reported on 09/25/2022)     pramoxine-hydrocortisone (PROCTOCREAM-HC) 1-1 % rectal cream Place 1 application rectally 2 (two) times daily. (Patient not taking: Reported on 09/25/2022) 30 g 2   tamsulosin (FLOMAX) 0.4 MG CAPS capsule Take 1 capsule (0.4 mg total) by mouth daily. (Patient not taking: Reported on 09/25/2022) 90 capsule 3   No current facility-administered medications on file prior to visit.    BP (!) 150/100   Pulse 64   Temp 97.6 F (36.4 C) (Oral)   Ht 6' 1"$  (1.854 m)   Wt 224 lb (101.6 kg)   SpO2 98%   BMI 29.55 kg/m       Objective:   Physical Exam Vitals and nursing note reviewed.  Constitutional:      General: He is not in acute distress.    Appearance: Normal appearance. He is not ill-appearing.  HENT:     Head: Normocephalic and atraumatic.     Right Ear: Tympanic membrane, ear canal and external ear normal. There is no impacted cerumen.     Left Ear: Tympanic membrane, ear canal and external ear normal. There is no impacted cerumen.     Nose: Nose normal. No congestion or rhinorrhea.     Mouth/Throat:     Mouth: Mucous membranes are moist.     Pharynx: Oropharynx is clear.  Eyes:     Extraocular Movements: Extraocular movements intact.     Conjunctiva/sclera: Conjunctivae normal.     Pupils: Pupils are  equal, round, and reactive to light.  Neck:     Vascular: No carotid bruit.  Cardiovascular:     Rate and Rhythm: Normal rate and regular rhythm.     Pulses: Normal pulses.     Heart sounds: No murmur heard.    No friction rub. No gallop.  Pulmonary:  Effort: Pulmonary effort is normal.     Breath sounds: Normal breath sounds.  Abdominal:     General: Abdomen is flat. Bowel sounds are normal. There is no distension.     Palpations: Abdomen is soft. There is no mass.     Tenderness: There is no abdominal tenderness. There is no guarding or rebound.     Hernia: No hernia is present.  Musculoskeletal:        General: Normal range of motion.     Cervical back: Normal range of motion and neck supple.  Lymphadenopathy:     Cervical: No cervical adenopathy.  Skin:    General: Skin is warm and dry.     Capillary Refill: Capillary refill takes less than 2 seconds.  Neurological:     General: No focal deficit present.     Mental Status: He is alert and oriented to person, place, and time.  Psychiatric:        Mood and Affect: Mood normal.        Behavior: Behavior normal.        Thought Content: Thought content normal.        Judgment: Judgment normal.       Assessment & Plan:  1. Routine general medical examination at a health care facility - Will try and keep his costs low since he is on a limited budget today   2. Hypertension, essential - BP controlled in the past on Norvasc 5 mg.  - amLODipine (NORVASC) 5 MG tablet; Take 1 tablet (5 mg total) by mouth daily.  Dispense: 90 tablet; Refill: 3 - Basic Metabolic Panel; Future - Hemoglobin A1c; Future - Hemoglobin 123456 - Basic Metabolic Panel  3. Gastroesophageal reflux disease without esophagitis  - omeprazole (PRILOSEC) 20 MG capsule; Take 1 capsule (20 mg total) by mouth daily.  Dispense: 90 capsule; Refill: 0 - Basic Metabolic Panel; Future - Hemoglobin A1c; Future - Hemoglobin 123456 - Basic Metabolic Panel  4.  Erectile dysfunction, unspecified erectile dysfunction type - Will increase Cialis to 20 mg daily PRN  - tadalafil (CIALIS) 20 MG tablet; TAKE ONE TABLET BY MOUTH DAILY AS NEEDED FOR ERECTILE DYSFUNCTION  Dispense: 30 tablet; Refill: 3  5. Benign prostatic hyperplasia with incomplete bladder emptying - Will switch him to Proscar 5 mg daily  6. Prediabetes - Consider metformin  - Basic Metabolic Panel; Future - Hemoglobin A1c; Future - Hemoglobin 123456 - Basic Metabolic Panel  [name], NP

## 2022-09-30 ENCOUNTER — Telehealth (INDEPENDENT_AMBULATORY_CARE_PROVIDER_SITE_OTHER): Payer: Self-pay | Admitting: Adult Health

## 2022-09-30 ENCOUNTER — Other Ambulatory Visit: Payer: Self-pay

## 2022-09-30 DIAGNOSIS — E118 Type 2 diabetes mellitus with unspecified complications: Secondary | ICD-10-CM

## 2022-09-30 MED ORDER — METFORMIN HCL 500 MG PO TABS: 500.0000 mg | ORAL_TABLET | Freq: Every day | ORAL | 0 refills | Status: DC

## 2022-09-30 NOTE — Progress Notes (Signed)
Virtual Visit via Video Note  I connected with Carlos Walker  on 09/30/22 at  4:15 PM EST by a video enabled telemedicine application and verified that I am speaking with the correct person using two identifiers.  Location patient: home Location provider:work or home office Persons participating in the virtual visit: patient, provider  I discussed the limitations of evaluation and management by telemedicine and the availability of in person appointments. The patient expressed understanding and agreed to proceed.   HPI: 62 year old male who was recently started on metformin due to diabetes mellitus with an A1c of 6.5.  He is concerned about taking metformin as well as has questions about what he can do to help lower his A1c.   ROS: See pertinent positives and negatives per HPI.  Past Medical History:  Diagnosis Date   Arthritis    Hyperlipidemia    Hypertension    Pain in lower back    epidural for pain orthopedics   Substance abuse ([place]) 1988   cocaine    Past Surgical History:  Procedure Laterality Date   CIRCUMCISION     COLONOSCOPY     Right Knee Surgery Right 01/16/2020   Excision prepatellar ganglion   right side repair     as child from football injury   TONSILLECTOMY     WISDOM TOOTH EXTRACTION      Family History  Problem Relation Age of Onset   Diabetes Mother    Heart disease Mother    Hyperlipidemia Mother    Stroke Mother    Heart disease Father    Heart attack Father    Hyperlipidemia Sister    Heart disease Brother    Renal Disease Brother    Colon cancer Neg Hx    Colon polyps Neg Hx    Esophageal cancer Neg Hx    Rectal cancer Neg Hx    Stomach cancer Neg Hx        Current Outpatient Medications:    amLODipine (NORVASC) 5 MG tablet, Take 1 tablet (5 mg total) by mouth daily., Disp: 90 tablet, Rfl: 3   Ascorbic Acid (VITAMIN C PO), Take by mouth daily., Disp: , Rfl:    BABY ASPIRIN PO, Take by mouth., Disp: , Rfl:    finasteride (PROSCAR)  5 MG tablet, Take 1 tablet (5 mg total) by mouth daily., Disp: 90 tablet, Rfl: 3   metFORMIN (GLUCOPHAGE) 500 MG tablet, Take 1 tablet (500 mg total) by mouth daily with breakfast., Disp: 90 tablet, Rfl: 0   omeprazole (PRILOSEC) 20 MG capsule, Take 1 capsule (20 mg total) by mouth daily., Disp: 90 capsule, Rfl: 0   pramoxine-hydrocortisone (PROCTOCREAM-HC) 1-1 % rectal cream, Place 1 application rectally 2 (two) times daily., Disp: 30 g, Rfl: 2   rosuvastatin (CRESTOR) 20 MG tablet, TAKE 1 TABLET BY MOUTH DAILY, Disp: 30 tablet, Rfl: 0   tadalafil (CIALIS) 20 MG tablet, TAKE ONE TABLET BY MOUTH DAILY AS NEEDED FOR ERECTILE DYSFUNCTION, Disp: 30 tablet, Rfl: 3   tamsulosin (FLOMAX) 0.4 MG CAPS capsule, Take 1 capsule (0.4 mg total) by mouth daily., Disp: 90 capsule, Rfl: 3  EXAM:  VITALS per patient if applicable:  GENERAL: alert, oriented, appears well and in no acute distress  HEENT: atraumatic, conjunttiva clear, no obvious abnormalities on inspection of external nose and ears  NECK: normal movements of the head and neck  LUNGS: on inspection no signs of respiratory distress, breathing rate appears normal, no obvious gross SOB, gasping or wheezing  CV: no obvious cyanosis  MS: moves all visible extremities without noticeable abnormality  PSYCH/NEURO: pleasant and cooperative, no obvious depression or anxiety, speech and thought processing grossly intact  ASSESSMENT AND PLAN:  Discussed the following assessment and plan:  1. Controlled type 2 diabetes mellitus with complication, without long-term current use of insulin (HCC) -Answered all questions to the best of my ability.  Advised that metformin is gold standard for her newly diagnosed diabetics and this will help lower his A1c.  He was also encouraged to exercise and eat a low-carb low sugar diet to improve his glucose.  He will follow-up in 3 months for repeat screening.      I discussed the assessment and treatment plan  with the patient. The patient was provided an opportunity to ask questions and all were answered. The patient agreed with the plan and demonstrated an understanding of the instructions.   The patient was advised to call back or seek an in-person evaluation if the symptoms worsen or if the condition fails to improve as anticipated.   [name], NP

## 2022-10-04 ENCOUNTER — Other Ambulatory Visit: Payer: Self-pay | Admitting: Adult Health

## 2022-10-04 DIAGNOSIS — E782 Mixed hyperlipidemia: Secondary | ICD-10-CM

## 2022-11-09 ENCOUNTER — Other Ambulatory Visit: Payer: Self-pay | Admitting: Adult Health

## 2022-11-09 DIAGNOSIS — E782 Mixed hyperlipidemia: Secondary | ICD-10-CM

## 2022-11-18 ENCOUNTER — Telehealth: Payer: Self-pay | Admitting: Adult Health

## 2022-11-18 NOTE — Telephone Encounter (Signed)
Prescription Request  11/18/2022  LOV: 09/25/2022  What is the name of the medication or equipment?  tadalafil (CIALIS) 20 MG tablet  Have you contacted your pharmacy to request a refill? No   Which pharmacy would you like this sent to?  [facility] [id number] [place], [place] - [address] [facility] [address] [place] [address] Phone: [phone] Fax: [phone]    Patient notified that their request is being sent to the clinical staff for review and that they should receive a response within 2 business days.   Please advise at Mobile [phone] (mobile)

## 2022-11-19 NOTE — Telephone Encounter (Signed)
Rx was sent on 09/25/22 for 30 tablets and 3 refills.

## 2022-12-15 ENCOUNTER — Other Ambulatory Visit: Payer: Self-pay | Admitting: Adult Health

## 2022-12-15 DIAGNOSIS — E782 Mixed hyperlipidemia: Secondary | ICD-10-CM

## 2022-12-24 ENCOUNTER — Encounter: Payer: Self-pay | Admitting: Adult Health

## 2022-12-24 ENCOUNTER — Other Ambulatory Visit: Payer: Self-pay

## 2022-12-24 ENCOUNTER — Ambulatory Visit (INDEPENDENT_AMBULATORY_CARE_PROVIDER_SITE_OTHER): Payer: Self-pay | Admitting: Adult Health

## 2022-12-24 VITALS — BP 120/80 | HR 65 | Temp 98.1°F | Ht 73.0 in | Wt 216.0 lb

## 2022-12-24 DIAGNOSIS — N529 Male erectile dysfunction, unspecified: Secondary | ICD-10-CM

## 2022-12-24 DIAGNOSIS — E118 Type 2 diabetes mellitus with unspecified complications: Secondary | ICD-10-CM

## 2022-12-24 DIAGNOSIS — I10 Essential (primary) hypertension: Secondary | ICD-10-CM

## 2022-12-24 DIAGNOSIS — E119 Type 2 diabetes mellitus without complications: Secondary | ICD-10-CM

## 2022-12-24 DIAGNOSIS — Z7984 Long term (current) use of oral hypoglycemic drugs: Secondary | ICD-10-CM

## 2022-12-24 LAB — MICROALBUMIN / CREATININE URINE RATIO
Creatinine,U: 145.6 mg/dL
Microalb Creat Ratio: 0.5 mg/g (ref 0.0–30.0)
Microalb, Ur: <0.7 mg/dL (ref 0.0–1.9)

## 2022-12-24 MED ORDER — METFORMIN HCL ER 500 MG PO TB24: 500.0000 mg | ORAL_TABLET | Freq: Every day | ORAL | 0 refills | Status: DC

## 2022-12-24 MED ORDER — TADALAFIL 20 MG PO TABS: ORAL_TABLET | ORAL | 3 refills | Status: DC

## 2022-12-24 MED ORDER — LANCETS MISC. MISC: 1.0000 | Freq: Three times a day (TID) | 0 refills | Status: AC

## 2022-12-24 MED ORDER — BLOOD GLUCOSE TEST VI STRP: 1.0000 | ORAL_STRIP | Freq: Three times a day (TID) | 0 refills | Status: AC

## 2022-12-24 MED ORDER — BLOOD GLUCOSE MONITORING SUPPL DEVI: 1.0000 | Freq: Three times a day (TID) | 0 refills | Status: AC

## 2022-12-24 NOTE — Patient Instructions (Addendum)
Your A1c has decreased to 5.9  I am going to change your Metformin to the extended release version, which should calm down the stomach   Lets follow up in 3 months to see how you are doing

## 2022-12-24 NOTE — Progress Notes (Signed)
Subjective:    Patient ID: Carlos Walker, male    DOB: Oct 06, 1960, 62 y.o.   MRN: [id number]  HPI 62 year old male who  has a past medical history of Arthritis, Hyperlipidemia, Hypertension, Pain in lower back, and Substance abuse ([facility]) (1988).  He presents to the office today for follow up regarding DM and HTN   DM Type 2 - diagnsoed in 09/2022 - he was subsequently started on Metformin 500 mg BID. He reports that since starting Metformin he has had abdominal bloating and nausea.  He does not check his blood sugars at home. He denies hypoglycemic episodes. He has cut back on sweets and fried foods. He is walking more often and riding a bike.  Lab Results  Component Value Date   HGBA1C 6.5 09/25/2022   Wt Readings from Last 3 Encounters:  12/24/22 216 lb (98 kg)  09/25/22 224 lb (101.6 kg)  09/17/21 223 lb (101.2 kg)   HTN - Managed with Norvasc 5 mg daily. He denies dizziness, lightheadedness, blurred vision or chest pain  BP Readings from Last 3 Encounters:  12/24/22 120/80  09/25/22 (!) 150/100  09/17/21 110/70    Review of Systems See HPI   Past Medical History:  Diagnosis Date   Arthritis    Hyperlipidemia    Hypertension    Pain in lower back    epidural for pain orthopedics   Substance abuse ([facility]) 1988   cocaine    Social History   Socioeconomic History   Marital status: Married    Spouse name: Not on file   Number of children: Not on file   Years of education: Not on file   Highest education level: Not on file  Occupational History   Not on file  Tobacco Use   Smoking status: Former    Packs/day: 0.25    Years: 20.00    Additional pack years: 0.00    Total pack years: 5.00    Types: Cigarettes    Quit date: 09/20/2016    Years since quitting: 6.2   Smokeless tobacco: Never   Tobacco comments:    less than 1/2 pack per day  Vaping Use   Vaping Use: Never used  Substance and Sexual Activity   Alcohol use: Never   Drug use: Not Currently     Types: Cocaine    Comment: quit 1988   Sexual activity: Not on file  Other Topics Concern   Not on file  Social History Narrative   Not on file   Social Determinants of Health   Financial Resource Strain: Not on file  Food Insecurity: Not on file  Transportation Needs: Not on file  Physical Activity: Not on file  Stress: Not on file  Social Connections: Not on file  Intimate Partner Violence: Not on file    Past Surgical History:  Procedure Laterality Date   CIRCUMCISION     COLONOSCOPY     Right Knee Surgery Right 01/16/2020   Excision prepatellar ganglion   right side repair     as child from football injury   TONSILLECTOMY     WISDOM TOOTH EXTRACTION      Family History  Problem Relation Age of Onset   Diabetes Mother    Heart disease Mother    Hyperlipidemia Mother    Stroke Mother    Heart disease Father    Heart attack Father    Hyperlipidemia Sister    Heart disease Brother    Renal  Disease Brother    Colon cancer Neg Hx    Colon polyps Neg Hx    Esophageal cancer Neg Hx    Rectal cancer Neg Hx    Stomach cancer Neg Hx     No Known Allergies  Current Outpatient Medications on File Prior to Visit  Medication Sig Dispense Refill   amLODipine (NORVASC) 5 MG tablet Take 1 tablet (5 mg total) by mouth daily. 90 tablet 3   Ascorbic Acid (VITAMIN C PO) Take by mouth daily.     BABY ASPIRIN PO Take by mouth.     finasteride (PROSCAR) 5 MG tablet Take 1 tablet (5 mg total) by mouth daily. 90 tablet 3   metFORMIN (GLUCOPHAGE) 500 MG tablet Take 1 tablet (500 mg total) by mouth daily with breakfast. 90 tablet 0   omeprazole (PRILOSEC) 20 MG capsule Take 1 capsule (20 mg total) by mouth daily. 90 capsule 0   pramoxine-hydrocortisone (PROCTOCREAM-HC) 1-1 % rectal cream Place 1 application rectally 2 (two) times daily. 30 g 2   rosuvastatin (CRESTOR) 20 MG tablet TAKE 1 TABLET BY MOUTH DAILY; SCHEDULE PHYSICAL WITH DOCTOR FOR MORE REFILLS 30 tablet 0   tadalafil  (CIALIS) 20 MG tablet TAKE ONE TABLET BY MOUTH DAILY AS NEEDED FOR ERECTILE DYSFUNCTION 30 tablet 3   tamsulosin (FLOMAX) 0.4 MG CAPS capsule Take 1 capsule (0.4 mg total) by mouth daily. 90 capsule 3   No current facility-administered medications on file prior to visit.    BP 120/80   Pulse 65   Temp 98.1 F (36.7 C) (Oral)   Ht 6\' 1"  (1.854 m)   Wt 216 lb (98 kg)   SpO2 96%   BMI 28.50 kg/m       Objective:   Physical Exam Vitals and nursing note reviewed.  Constitutional:      Appearance: Normal appearance. He is obese.  Cardiovascular:     Rate and Rhythm: Normal rate and regular rhythm.     Pulses: Normal pulses.     Heart sounds: Normal heart sounds.  Pulmonary:     Effort: Pulmonary effort is normal.     Breath sounds: Normal breath sounds.  Skin:    General: Skin is warm and dry.  Neurological:     General: No focal deficit present.     Mental Status: He is alert and oriented to person, place, and time.  Psychiatric:        Mood and Affect: Mood normal.        Behavior: Behavior normal.        Thought Content: Thought content normal.        Judgment: Judgment normal.       Assessment & Plan:  1. Controlled type 2 diabetes mellitus with complication, without long-term current use of insulin (HCC)  - Blood Glucose Monitoring Suppl DEVI; 1 each by Does not apply route in the morning, at noon, and at bedtime. May substitute to any manufacturer covered by patient's insurance.  Dispense: 1 each; Refill: 0 - Glucose Blood (BLOOD GLUCOSE TEST STRIPS) STRP; 1 each by In Vitro route in the morning, at noon, and at bedtime. May substitute to any manufacturer covered by patient's insurance.  Dispense: 100 strip; Refill: 0 - Lancets Misc. MISC; 1 each by Does not apply route in the morning, at noon, and at bedtime. May substitute to any manufacturer covered by patient's insurance.  Dispense: 100 each; Refill: 0 - metFORMIN (GLUCOPHAGE-XR) 500 MG 24 hr tablet; Take 1  tablet  (500 mg total) by mouth daily with breakfast.  Dispense: 90 tablet; Refill: 0 - Microalbumin/Creatinine Ratio, Urine; Future  2. Hypertension, essential  - Microalbumin/Creatinine Ratio, Urine; Future  3. Diabetes mellitus treated with oral medication (HCC)  Shirline Frees, NP

## 2022-12-27 ENCOUNTER — Other Ambulatory Visit: Payer: Self-pay | Admitting: Adult Health

## 2022-12-30 NOTE — Telephone Encounter (Signed)
  The original prescription was discontinued on 12/24/2022 by Shirline Frees, NP. Renewing this prescription may not be appropriate.   Rx refused

## 2023-01-08 ENCOUNTER — Other Ambulatory Visit: Payer: Self-pay | Admitting: Adult Health

## 2023-01-08 DIAGNOSIS — K219 Gastro-esophageal reflux disease without esophagitis: Secondary | ICD-10-CM

## 2023-01-12 ENCOUNTER — Telehealth: Payer: Self-pay | Admitting: Adult Health

## 2023-01-12 NOTE — Telephone Encounter (Signed)
Pt is calling checking on  glucometer etc  [facility] [id number] [place], [place] - [address] [facility] [address] Phone: [phone]  Fax: [phone]

## 2023-01-13 NOTE — Telephone Encounter (Signed)
Pt called to say he went to the pharmacy to pick this up and it was way too expensive.   When he protested, they gave him a discount, and then the price went down to $240, which is still too expensive.  Please advise.

## 2023-01-13 NOTE — Telephone Encounter (Signed)
Tried to call pt and pharmacy to see what we can do on our end but no answer. Will try again later.

## 2023-01-14 NOTE — Telephone Encounter (Signed)
Spoke to pt and he stated that the price was expensive for glucometer. I advised pt that we sent the standard glucometer and it should have been based off pt insurance. I informed pt that I would call the pharmacy to see what the issue is. Pt verbalized understanding.

## 2023-01-15 NOTE — Telephone Encounter (Signed)
Calling to check on progress of previous request regarding glucometer

## 2023-01-15 NOTE — Telephone Encounter (Signed)
Patient notified of update  and verbalized understanding. 

## 2023-01-15 NOTE — Telephone Encounter (Signed)
Spoke to pharmacy and bc pt does not have insurance the test strips were $238. Pt glucometer is $25. Tried to call pt for update but no answer.

## 2023-01-20 ENCOUNTER — Other Ambulatory Visit: Payer: Self-pay | Admitting: Adult Health

## 2023-01-20 DIAGNOSIS — E782 Mixed hyperlipidemia: Secondary | ICD-10-CM

## 2023-01-21 ENCOUNTER — Telehealth: Payer: Self-pay | Admitting: Adult Health

## 2023-01-21 NOTE — Telephone Encounter (Signed)
Patient notified of update  and verbalized understanding. 

## 2023-01-21 NOTE — Telephone Encounter (Signed)
Please advise 

## 2023-01-21 NOTE — Telephone Encounter (Signed)
Pt called to say he is still waiting for a call back.   Pt was transferred to Triage Nurse for advice.

## 2023-01-21 NOTE — Telephone Encounter (Signed)
Pt requesting a call regarding his glucose levels and where they should fall. Says last reading was 94 this morning.

## 2023-02-18 ENCOUNTER — Other Ambulatory Visit: Payer: Self-pay | Admitting: Adult Health

## 2023-02-18 DIAGNOSIS — E782 Mixed hyperlipidemia: Secondary | ICD-10-CM

## 2023-03-11 ENCOUNTER — Encounter (INDEPENDENT_AMBULATORY_CARE_PROVIDER_SITE_OTHER): Payer: Self-pay

## 2023-03-21 ENCOUNTER — Other Ambulatory Visit: Payer: Self-pay | Admitting: Adult Health

## 2023-03-21 DIAGNOSIS — E782 Mixed hyperlipidemia: Secondary | ICD-10-CM

## 2023-03-21 DIAGNOSIS — E118 Type 2 diabetes mellitus with unspecified complications: Secondary | ICD-10-CM

## 2023-03-26 ENCOUNTER — Ambulatory Visit (INDEPENDENT_AMBULATORY_CARE_PROVIDER_SITE_OTHER): Payer: Self-pay | Admitting: Adult Health

## 2023-03-26 ENCOUNTER — Encounter: Payer: Self-pay | Admitting: Adult Health

## 2023-03-26 VITALS — BP 122/82 | HR 64 | Temp 97.7°F | Ht 73.0 in | Wt 217.0 lb

## 2023-03-26 DIAGNOSIS — Z7984 Long term (current) use of oral hypoglycemic drugs: Secondary | ICD-10-CM

## 2023-03-26 DIAGNOSIS — E118 Type 2 diabetes mellitus with unspecified complications: Secondary | ICD-10-CM

## 2023-03-26 DIAGNOSIS — I10 Essential (primary) hypertension: Secondary | ICD-10-CM

## 2023-03-26 DIAGNOSIS — N529 Male erectile dysfunction, unspecified: Secondary | ICD-10-CM

## 2023-03-26 LAB — POCT GLYCOSYLATED HEMOGLOBIN (HGB A1C): Hemoglobin A1C: 6 % — AB (ref 4.0–5.6)

## 2023-03-26 MED ORDER — TADALAFIL 20 MG PO TABS
ORAL_TABLET | ORAL | 1 refills | Status: DC
Start: 2023-03-26 — End: 2023-06-17

## 2023-03-26 NOTE — Progress Notes (Signed)
Subjective:    Patient ID: Carlos Walker, male    DOB: [birth year], 62 y.o.   MRN: [id number]  HPI  62 year old male who  has a past medical history of Arthritis, Hyperlipidemia, Hypertension, Pain in lower back, and Substance abuse (HCC) (1988).  DM Type 2 - diagnsoed in 09/2022 - he was subsequently started on Metformin 500 mg ER daily. He has cut back on sweets and fried foods. He is walking 2-4 miles a day.  Lab Results  Component Value Date   HGBA1C 6.5 09/25/2022   Wt Readings from Last 3 Encounters:  03/26/23 217 lb (98.4 kg)  12/24/22 216 lb (98 kg)  09/25/22 224 lb (101.6 kg)   HTN - Managed with Norvasc 5 mg daily. He denies dizziness, lightheadedness, blurred vision or chest pain  BP Readings from Last 3 Encounters:  03/26/23 122/82  12/24/22 120/80  09/25/22 (!) 150/100   He also needs a refill of Cialis   Review of Systems See HPI   Past Medical History:  Diagnosis Date   Arthritis    Hyperlipidemia    Hypertension    Pain in lower back    epidural for pain orthopedics   Substance abuse (HCC) 1988   cocaine    Social History   Socioeconomic History   Marital status: Married    Spouse name: Not on file   Number of children: Not on file   Years of education: Not on file   Highest education level: Not on file  Occupational History   Not on file  Tobacco Use   Smoking status: Former    Current packs/day: 0.00    Average packs/day: 0.3 packs/day for 20.0 years (5.0 ttl pk-yrs)    Types: Cigarettes    Start date: 09/20/1996    Quit date: 09/20/2016    Years since quitting: 6.5   Smokeless tobacco: Never   Tobacco comments:    less than 1/2 pack per day  Vaping Use   Vaping status: Never Used  Substance and Sexual Activity   Alcohol use: Never   Drug use: Not Currently    Types: Cocaine    Comment: quit 1988   Sexual activity: Not on file  Other Topics Concern   Not on file  Social History Narrative   Not on file   Social Determinants of  Health   Financial Resource Strain: Not on file  Food Insecurity: Not on file  Transportation Needs: Not on file  Physical Activity: Not on file  Stress: Not on file  Social Connections: Not on file  Intimate Partner Violence: Not on file    Past Surgical History:  Procedure Laterality Date   CIRCUMCISION     COLONOSCOPY     Right Knee Surgery Right 01/16/2020   Excision prepatellar ganglion   right side repair     as child from football injury   TONSILLECTOMY     WISDOM TOOTH EXTRACTION      Family History  Problem Relation Age of Onset   Diabetes Mother    Heart disease Mother    Hyperlipidemia Mother    Stroke Mother    Heart disease Father    Heart attack Father    Hyperlipidemia Sister    Heart disease Brother    Renal Disease Brother    Colon cancer Neg Hx    Colon polyps Neg Hx    Esophageal cancer Neg Hx    Rectal cancer Neg Hx  Stomach cancer Neg Hx     No Known Allergies  Current Outpatient Medications on File Prior to Visit  Medication Sig Dispense Refill   amLODipine (NORVASC) 5 MG tablet Take 1 tablet (5 mg total) by mouth daily. 90 tablet 3   Ascorbic Acid (VITAMIN C PO) Take by mouth daily.     BABY ASPIRIN PO Take by mouth.     Blood Glucose Monitoring Suppl DEVI 1 each by Does not apply route in the morning, at noon, and at bedtime. May substitute to any manufacturer covered by patient's insurance. 1 each 0   finasteride (PROSCAR) 5 MG tablet Take 1 tablet (5 mg total) by mouth daily. 90 tablet 3   metFORMIN (GLUCOPHAGE-XR) 500 MG 24 hr tablet TAKE 1 TABLET BY MOUTH DAILY WITH BREAKFAST 90 tablet 0   omeprazole (PRILOSEC) 20 MG capsule TAKE 1 CAPSULE BY MOUTH DAILY 90 capsule 0   pramoxine-hydrocortisone (PROCTOCREAM-HC) 1-1 % rectal cream Place 1 application rectally 2 (two) times daily. 30 g 2   rosuvastatin (CRESTOR) 20 MG tablet TAKE 1 TABLET BY MOUTH DAILY 30 tablet 0   tadalafil (CIALIS) 20 MG tablet TAKE ONE TABLET BY MOUTH DAILY AS  NEEDED FOR ERECTILE DYSFUNCTION 30 tablet 3   tamsulosin (FLOMAX) 0.4 MG CAPS capsule Take 1 capsule (0.4 mg total) by mouth daily. 90 capsule 3   No current facility-administered medications on file prior to visit.    BP 122/82   Pulse 64   Temp 97.7 F (36.5 C) (Oral)   Ht 6\' 1"  (1.854 m)   Wt 217 lb (98.4 kg)   SpO2 98%   BMI 28.63 kg/m       Objective:   Physical Exam Vitals and nursing note reviewed.  Constitutional:      Appearance: Normal appearance.  Cardiovascular:     Rate and Rhythm: Normal rate and regular rhythm.     Pulses: Normal pulses.     Heart sounds: Normal heart sounds.  Pulmonary:     Effort: Pulmonary effort is normal.     Breath sounds: Normal breath sounds.  Skin:    General: Skin is warm and dry.  Neurological:     General: No focal deficit present.     Mental Status: He is alert and oriented to person, place, and time.  Psychiatric:        Mood and Affect: Mood normal.        Behavior: Behavior normal.        Thought Content: Thought content normal.        Judgment: Judgment normal.        Assessment & Plan:  1. Controlled type 2 diabetes mellitus with complication, without long-term current use of insulin (HCC) - He is doing great! Continue to exercise and eat healthy  - POC HgB A1c- 6.0  - Continue with metformin  - Follow up in 3 months for CPE   2. Hypertension, essential - Well controlled. No change in medication   3. Erectile dysfunction, unspecified erectile dysfunction type  - tadalafil (CIALIS) 20 MG tablet; TAKE ONE TABLET BY MOUTH DAILY AS NEEDED FOR ERECTILE DYSFUNCTION  Dispense: 90 tablet; Refill: 1  Shirline Frees, NP

## 2023-03-26 NOTE — Patient Instructions (Signed)
Your A1c today was 6.0 - this is great.   I am going to keep you on metformin for the time being   Keep working hard  Follow up first week of November for your annual exam

## 2023-04-08 ENCOUNTER — Other Ambulatory Visit: Payer: Self-pay | Admitting: Adult Health

## 2023-04-08 DIAGNOSIS — K219 Gastro-esophageal reflux disease without esophagitis: Secondary | ICD-10-CM

## 2023-04-25 ENCOUNTER — Other Ambulatory Visit: Payer: Self-pay | Admitting: Adult Health

## 2023-04-25 DIAGNOSIS — E782 Mixed hyperlipidemia: Secondary | ICD-10-CM

## 2023-05-28 ENCOUNTER — Other Ambulatory Visit: Payer: Self-pay | Admitting: Adult Health

## 2023-05-28 DIAGNOSIS — E782 Mixed hyperlipidemia: Secondary | ICD-10-CM

## 2023-06-17 ENCOUNTER — Encounter: Payer: Self-pay | Admitting: Adult Health

## 2023-06-17 ENCOUNTER — Ambulatory Visit (INDEPENDENT_AMBULATORY_CARE_PROVIDER_SITE_OTHER): Payer: Self-pay | Admitting: Adult Health

## 2023-06-17 VITALS — BP 120/80 | HR 59 | Temp 98.9°F | Ht 73.0 in | Wt 208.0 lb

## 2023-06-17 DIAGNOSIS — R350 Frequency of micturition: Secondary | ICD-10-CM

## 2023-06-17 DIAGNOSIS — I10 Essential (primary) hypertension: Secondary | ICD-10-CM

## 2023-06-17 DIAGNOSIS — Z7984 Long term (current) use of oral hypoglycemic drugs: Secondary | ICD-10-CM

## 2023-06-17 DIAGNOSIS — N529 Male erectile dysfunction, unspecified: Secondary | ICD-10-CM

## 2023-06-17 DIAGNOSIS — E785 Hyperlipidemia, unspecified: Secondary | ICD-10-CM

## 2023-06-17 DIAGNOSIS — Z0000 Encounter for general adult medical examination without abnormal findings: Secondary | ICD-10-CM

## 2023-06-17 DIAGNOSIS — N401 Enlarged prostate with lower urinary tract symptoms: Secondary | ICD-10-CM

## 2023-06-17 DIAGNOSIS — E119 Type 2 diabetes mellitus without complications: Secondary | ICD-10-CM

## 2023-06-17 DIAGNOSIS — E118 Type 2 diabetes mellitus with unspecified complications: Secondary | ICD-10-CM

## 2023-06-17 LAB — LIPID PANEL
Cholesterol: 142 mg/dL (ref 0–200)
HDL: 46.8 mg/dL (ref >39.00)
LDL Cholesterol: 85 mg/dL (ref 0–99)
NonHDL: 95.34
Total CHOL/HDL Ratio: 3
Triglycerides: 52 mg/dL (ref 0.0–149.0)
VLDL: 10.4 mg/dL (ref 0.0–40.0)

## 2023-06-17 LAB — COMPREHENSIVE METABOLIC PANEL
ALT: 12 U/L (ref 0–53)
AST: 12 U/L (ref 0–37)
Albumin: 4.3 g/dL (ref 3.5–5.2)
Alkaline Phosphatase: 53 U/L (ref 39–117)
BUN: 16 mg/dL (ref 6–23)
CO2: 28 meq/L (ref 19–32)
Calcium: 9.4 mg/dL (ref 8.4–10.5)
Chloride: 104 meq/L (ref 96–112)
Creatinine, Ser: 1.2 mg/dL (ref 0.40–1.50)
GFR: 64.63 mL/min (ref >60.00)
Glucose, Bld: 89 mg/dL (ref 70–99)
Potassium: 4.1 meq/L (ref 3.5–5.1)
Sodium: 138 meq/L (ref 135–145)
Total Bilirubin: 0.5 mg/dL (ref 0.2–1.2)
Total Protein: 7.2 g/dL (ref 6.0–8.3)

## 2023-06-17 LAB — CBC
HCT: 43.3 % (ref 39.0–52.0)
Hemoglobin: 13.7 g/dL (ref 13.0–17.0)
MCHC: 31.8 g/dL (ref 30.0–36.0)
MCV: 88.6 fL (ref 78.0–100.0)
Platelets: 205 10*3/uL (ref 150.0–400.0)
RBC: 4.89 Mil/uL (ref 4.22–5.81)
RDW: 14.1 % (ref 11.5–15.5)
WBC: 3.4 10*3/uL — ABNORMAL LOW (ref 4.0–10.5)

## 2023-06-17 LAB — PSA: PSA: 0.91 ng/mL (ref 0.10–4.00)

## 2023-06-17 LAB — MICROALBUMIN / CREATININE URINE RATIO
Creatinine,U: 213.4 mg/dL
Microalb Creat Ratio: 0.3 mg/g (ref 0.0–30.0)
Microalb, Ur: <0.7 mg/dL (ref 0.0–1.9)

## 2023-06-17 LAB — HEMOGLOBIN A1C: Hgb A1c MFr Bld: 6.6 % — ABNORMAL HIGH (ref 4.6–6.5)

## 2023-06-17 MED ORDER — TADALAFIL 20 MG PO TABS: ORAL_TABLET | ORAL | 1 refills | Status: DC

## 2023-06-17 MED ORDER — ROSUVASTATIN CALCIUM 20 MG PO TABS
20.0000 mg | ORAL_TABLET | Freq: Every day | ORAL | 3 refills | Status: DC
Start: 2023-06-17 — End: 2023-07-03

## 2023-06-17 NOTE — Progress Notes (Signed)
Subjective:    Patient ID: Carlos Walker, male    DOB: [birth year], 62 y.o.   MRN: [id number]  HPI Patient presents for yearly preventative medicine examination. He is a 62 year old male who  has a past medical history of Arthritis, Hyperlipidemia, Hypertension, Pain in lower back, and Substance abuse (HCC) (1988).  DM Type 2 - diagnsoed in 09/2022 - he was subsequently started on Metformin 500 mg ER daily. He has cut back on sweets and fried foods. He is walking 2-4 miles a day.  Lab Results  Component Value Date   HGBA1C 6.0 (A) 03/26/2023   HGBA1C 6.5 09/25/2022   HGBA1C 6.2 (A) 09/17/2021   Wt Readings from Last 8 Encounters:  06/17/23 208 lb (94.3 kg)  03/26/23 217 lb (98.4 kg)  12/24/22 216 lb (98 kg)  09/25/22 224 lb (101.6 kg)  09/17/21 223 lb (101.2 kg)  09/10/21 225 lb (102.1 kg)  07/26/21 220 lb (99.8 kg)  06/21/21 220 lb (99.8 kg)   HTN - Managed with Norvasc 5 mg daily. He denies dizziness, lightheadedness, blurred vision or chest pain  BP Readings from Last 3 Encounters:  06/17/23 120/80  03/26/23 122/82  12/24/22 120/80   Hyperlipidemia - managed with Crestor 20 mg daily. He denies myalgia or fatigue  Lab Results  Component Value Date   CHOL 133 06/06/2021   HDL 54.00 06/06/2021   LDLCALC 70 06/06/2021   LDLDIRECT 154.1 01/10/2013   TRIG 43.0 06/06/2021   CHOLHDL 2 06/06/2021    BPH - managed with proscar 5 mg daily. He feels well controlled.   ED - uses Cialsis 20 mg PRN  All immunizations and health maintenance protocols were reviewed with the patient and needed orders were placed.  Appropriate screening laboratory values were ordered for the patient including screening of hyperlipidemia, renal function and hepatic function. If indicated by BPH, a PSA was ordered.  Medication reconciliation,  past medical history, social history, problem list and allergies were reviewed in detail with the patient  Goals were established with regard to weight  loss, exercise, and  diet in compliance with medications  He is up to date on routine colon cancer screening.    Review of Systems  Constitutional: Negative.   HENT: Negative.    Eyes: Negative.   Respiratory: Negative.    Cardiovascular: Negative.   Gastrointestinal: Negative.   Endocrine: Negative.   Genitourinary: Negative.   Musculoskeletal: Negative.   Skin: Negative.   Allergic/Immunologic: Negative.   Neurological: Negative.   Hematological: Negative.   Psychiatric/Behavioral: Negative.    All other systems reviewed and are negative.   Past Medical History:  Diagnosis Date   Arthritis    Hyperlipidemia    Hypertension    Pain in lower back    epidural for pain orthopedics   Substance abuse (HCC) 1988   cocaine    Social History   Socioeconomic History   Marital status: Married    Spouse name: Not on file   Number of children: Not on file   Years of education: Not on file   Highest education level: Not on file  Occupational History   Not on file  Tobacco Use   Smoking status: Former    Current packs/day: 0.00    Average packs/day: 0.3 packs/day for 20.0 years (5.0 ttl pk-yrs)    Types: Cigarettes    Start date: 09/20/1996    Quit date: 09/20/2016    Years since quitting: 6.7   Smokeless  tobacco: Never   Tobacco comments:    less than 1/2 pack per day  Vaping Use   Vaping status: Never Used  Substance and Sexual Activity   Alcohol use: Never   Drug use: Not Currently    Types: Cocaine    Comment: quit 1988   Sexual activity: Not on file  Other Topics Concern   Not on file  Social History Narrative   Not on file   Social Determinants of Health   Financial Resource Strain: Not on file  Food Insecurity: Not on file  Transportation Needs: Not on file  Physical Activity: Not on file  Stress: Not on file  Social Connections: Not on file  Intimate Partner Violence: Not on file    Past Surgical History:  Procedure Laterality Date    CIRCUMCISION     COLONOSCOPY     Right Knee Surgery Right 01/16/2020   Excision prepatellar ganglion   right side repair     as child from football injury   TONSILLECTOMY     WISDOM TOOTH EXTRACTION      Family History  Problem Relation Age of Onset   Diabetes Mother    Heart disease Mother    Hyperlipidemia Mother    Stroke Mother    Heart disease Father    Heart attack Father    Hyperlipidemia Sister    Heart disease Brother    Renal Disease Brother    Colon cancer Neg Hx    Colon polyps Neg Hx    Esophageal cancer Neg Hx    Rectal cancer Neg Hx    Stomach cancer Neg Hx     No Known Allergies  Current Outpatient Medications on File Prior to Visit  Medication Sig Dispense Refill   amLODipine (NORVASC) 5 MG tablet Take 1 tablet (5 mg total) by mouth daily. 90 tablet 3   Ascorbic Acid (VITAMIN C PO) Take by mouth daily.     BABY ASPIRIN PO Take by mouth.     Blood Glucose Monitoring Suppl DEVI 1 each by Does not apply route in the morning, at noon, and at bedtime. May substitute to any manufacturer covered by patient's insurance. 1 each 0   finasteride (PROSCAR) 5 MG tablet Take 1 tablet (5 mg total) by mouth daily. 90 tablet 3   metFORMIN (GLUCOPHAGE-XR) 500 MG 24 hr tablet TAKE 1 TABLET BY MOUTH DAILY WITH BREAKFAST 90 tablet 0   omeprazole (PRILOSEC) 20 MG capsule TAKE 1 CAPSULE BY MOUTH DAILY 90 capsule 0   pramoxine-hydrocortisone (PROCTOCREAM-HC) 1-1 % rectal cream Place 1 application rectally 2 (two) times daily. 30 g 2   rosuvastatin (CRESTOR) 20 MG tablet TAKE 1 TABLET BY MOUTH DAILY 30 tablet 0   tadalafil (CIALIS) 20 MG tablet TAKE ONE TABLET BY MOUTH DAILY AS NEEDED FOR ERECTILE DYSFUNCTION 90 tablet 1   tamsulosin (FLOMAX) 0.4 MG CAPS capsule Take 1 capsule (0.4 mg total) by mouth daily. 90 capsule 3   No current facility-administered medications on file prior to visit.    BP 120/80   Pulse (!) 59   Temp 98.9 F (37.2 C) (Oral)   Ht 6\' 1"  (1.854 m)   Wt  208 lb (94.3 kg)   SpO2 98%   BMI 27.44 kg/m       Objective:   Physical Exam Vitals and nursing note reviewed.  Constitutional:      General: He is not in acute distress.    Appearance: Normal appearance. He is not  ill-appearing.  HENT:     Head: Normocephalic and atraumatic.     Right Ear: Tympanic membrane, ear canal and external ear normal. There is no impacted cerumen.     Left Ear: Tympanic membrane, ear canal and external ear normal. There is no impacted cerumen.     Nose: Nose normal. No congestion or rhinorrhea.     Mouth/Throat:     Mouth: Mucous membranes are moist.     Pharynx: Oropharynx is clear.  Eyes:     Extraocular Movements: Extraocular movements intact.     Conjunctiva/sclera: Conjunctivae normal.     Pupils: Pupils are equal, round, and reactive to light.  Neck:     Vascular: No carotid bruit.  Cardiovascular:     Rate and Rhythm: Normal rate and regular rhythm.     Pulses: Normal pulses.     Heart sounds: No murmur heard.    No friction rub. No gallop.  Pulmonary:     Effort: Pulmonary effort is normal.     Breath sounds: Normal breath sounds.  Abdominal:     General: Abdomen is flat. Bowel sounds are normal. There is no distension.     Palpations: Abdomen is soft. There is no mass.     Tenderness: There is no abdominal tenderness. There is no guarding or rebound.     Hernia: No hernia is present.  Musculoskeletal:        General: Normal range of motion.     Cervical back: Normal range of motion and neck supple.  Lymphadenopathy:     Cervical: No cervical adenopathy.  Skin:    General: Skin is warm and dry.     Capillary Refill: Capillary refill takes less than 2 seconds.  Neurological:     General: No focal deficit present.     Mental Status: He is alert and oriented to person, place, and time.  Psychiatric:        Mood and Affect: Mood normal.        Behavior: Behavior normal.        Thought Content: Thought content normal.         Judgment: Judgment normal.       Assessment & Plan:  1. Routine general medical examination at a health care facility Today patient counseled on age appropriate routine health concerns for screening and prevention, each reviewed and up to date or declined. Immunizations reviewed and up to date or declined. Labs ordered and reviewed. Risk factors for depression reviewed and negative. Hearing function and visual acuity are intact. ADLs screened and addressed as needed. Functional ability and level of safety reviewed and appropriate. Education, counseling and referrals performed based on assessed risks today. Patient provided with a copy of personalized plan for preventive services. - Follow up in one year or sooner if needed - Continue to eat and stay active   2. Diabetes mellitus treated with oral medication (HCC) - Consider dose change of metformin  - Likely follow up in 3 months  - He has lost 16 pounds since being diagnosed  - Lipid panel; Future - CBC; Future - Comprehensive metabolic panel; Future - PSA; Future - Hemoglobin A1c; Future - Microalbumin/Creatinine Ratio, Urine; Future  3. Hypertension, essential - Well controlled. No change in medication  - Lipid panel; Future - CBC; Future - Comprehensive metabolic panel; Future  4. Hyperlipidemia, unspecified hyperlipidemia type - Continue Crestor  - Lipid panel; Future - CBC; Future - Comprehensive metabolic panel; Future - rosuvastatin (CRESTOR) 20  MG tablet; Take 1 tablet (20 mg total) by mouth daily.  Dispense: 90 tablet; Refill: 3  5. Benign prostatic hyperplasia with urinary frequency - Continue Avodart  - PSA; Future  6. Erectile dysfunction, unspecified erectile dysfunction type - Continue Cialis  - Lipid panel; Future - CBC; Future - Comprehensive metabolic panel; Future - tadalafil (CIALIS) 20 MG tablet; TAKE ONE TABLET BY MOUTH DAILY AS NEEDED FOR ERECTILE DYSFUNCTION  Dispense: 90 tablet; Refill: 1

## 2023-06-17 NOTE — Patient Instructions (Signed)
It was great seeing you today   We will follow up with you regarding your lab work   Please let me know if you need anything   

## 2023-06-18 ENCOUNTER — Other Ambulatory Visit: Payer: Self-pay | Admitting: Adult Health

## 2023-06-18 DIAGNOSIS — E118 Type 2 diabetes mellitus with unspecified complications: Secondary | ICD-10-CM

## 2023-06-18 MED ORDER — METFORMIN HCL ER 500 MG PO TB24: 500.0000 mg | ORAL_TABLET | Freq: Every day | ORAL | 0 refills | Status: DC

## 2023-06-18 NOTE — Addendum Note (Signed)
Addended by: Waymon Amato R on: 06/18/2023 11:37 AM   Modules accepted: Orders

## 2023-07-03 ENCOUNTER — Other Ambulatory Visit: Payer: Self-pay | Admitting: Adult Health

## 2023-07-03 DIAGNOSIS — E785 Hyperlipidemia, unspecified: Secondary | ICD-10-CM

## 2023-07-12 ENCOUNTER — Other Ambulatory Visit: Payer: Self-pay | Admitting: Adult Health

## 2023-07-12 DIAGNOSIS — K219 Gastro-esophageal reflux disease without esophagitis: Secondary | ICD-10-CM

## 2023-09-27 ENCOUNTER — Other Ambulatory Visit: Payer: Self-pay | Admitting: Adult Health

## 2023-09-27 DIAGNOSIS — I10 Essential (primary) hypertension: Secondary | ICD-10-CM

## 2023-12-24 ENCOUNTER — Other Ambulatory Visit: Payer: Self-pay | Admitting: Adult Health

## 2023-12-24 DIAGNOSIS — E118 Type 2 diabetes mellitus with unspecified complications: Secondary | ICD-10-CM

## 2024-01-03 ENCOUNTER — Other Ambulatory Visit: Payer: Self-pay | Admitting: Adult Health

## 2024-01-03 DIAGNOSIS — N529 Male erectile dysfunction, unspecified: Secondary | ICD-10-CM

## 2024-03-27 ENCOUNTER — Other Ambulatory Visit: Payer: Self-pay | Admitting: Adult Health

## 2024-03-27 DIAGNOSIS — E118 Type 2 diabetes mellitus with unspecified complications: Secondary | ICD-10-CM

## 2024-07-04 ENCOUNTER — Other Ambulatory Visit: Payer: Self-pay | Admitting: Adult Health

## 2024-07-04 DIAGNOSIS — E118 Type 2 diabetes mellitus with unspecified complications: Secondary | ICD-10-CM

## 2024-07-06 ENCOUNTER — Other Ambulatory Visit: Payer: Self-pay | Admitting: Adult Health

## 2024-07-06 DIAGNOSIS — N529 Male erectile dysfunction, unspecified: Secondary | ICD-10-CM

## 2024-07-08 ENCOUNTER — Other Ambulatory Visit: Payer: Self-pay | Admitting: Adult Health

## 2024-07-08 DIAGNOSIS — E785 Hyperlipidemia, unspecified: Secondary | ICD-10-CM

## 2024-07-10 ENCOUNTER — Other Ambulatory Visit: Payer: Self-pay | Admitting: Adult Health

## 2024-07-10 DIAGNOSIS — E785 Hyperlipidemia, unspecified: Secondary | ICD-10-CM

## 2024-08-03 ENCOUNTER — Other Ambulatory Visit: Payer: Self-pay | Admitting: Adult Health

## 2024-08-03 DIAGNOSIS — E118 Type 2 diabetes mellitus with unspecified complications: Secondary | ICD-10-CM

## 2024-09-01 ENCOUNTER — Other Ambulatory Visit: Payer: Self-pay | Admitting: Adult Health

## 2024-09-01 DIAGNOSIS — E118 Type 2 diabetes mellitus with unspecified complications: Secondary | ICD-10-CM

## 2024-09-01 DIAGNOSIS — E785 Hyperlipidemia, unspecified: Secondary | ICD-10-CM

## 2024-09-01 DIAGNOSIS — I10 Essential (primary) hypertension: Secondary | ICD-10-CM

## 2024-09-02 ENCOUNTER — Telehealth: Payer: Self-pay

## 2024-09-02 NOTE — Telephone Encounter (Signed)
 Patient notified that Rx was sent in and needs to make an appt for CPE. Pt verbalized understanding.

## 2024-09-02 NOTE — Telephone Encounter (Signed)
 Copied from CRM #[id number]. Topic: Clinical - Medication Question >> Sep 02, 2024  8:04 AM Berneda FALCON wrote: Reason for CRM: Patient is calling to check on the status of his medication refills. It looks like they were possibly sent in this morning, but showing as flagged, so I am not sure if they were actually sent to the pharmacy or not.  Medications: rosuvastatin  (CRESTOR ) 20 MG tablet (he only has 1 left) metFORMIN  (GLUCOPHAGE -XR) 500 MG 24 hr tablet finasteride  (PROSCAR ) 5 MG tablet  amLODipine  (NORVASC ) 5 MG tablet  Pharmacy: [facility] [id number] - [place], [place] - [address] [facility] [address] [place], [place] [address] Phone: [phone]  Fax: [phone]  Please call patient back with any updates- [phone]

## 2024-09-15 LAB — OPHTHALMOLOGY REPORT-SCANNED

## 2024-09-20 ENCOUNTER — Encounter: Payer: Self-pay | Admitting: Adult Health
